# Patient Record
Sex: Female | Born: 1985 | Race: Black or African American | Hispanic: No | Marital: Single | Smoking: Current every day smoker
Health system: Southern US, Community
[De-identification: ages and names within clinical notes are randomized; demographics above are authoritative.]

## PROBLEM LIST (undated history)

## (undated) HISTORY — PX: PILONIDAL CYST DRAINAGE: SHX743

## (undated) HISTORY — PX: TUBAL LIGATION: SHX77

---

## 2018-10-19 ENCOUNTER — Other Ambulatory Visit: Payer: Self-pay

## 2018-10-19 ENCOUNTER — Emergency Department
Admission: EM | Admit: 2018-10-19 | Discharge: 2018-10-19 | Disposition: A | Discharge status: Home / Self Care | Payer: Medicaid Other | Attending: Emergency Medicine | Admitting: Emergency Medicine

## 2018-10-19 ENCOUNTER — Encounter: Payer: Self-pay | Admitting: Emergency Medicine

## 2018-10-19 DIAGNOSIS — F17210 Nicotine dependence, cigarettes, uncomplicated: Secondary | ICD-10-CM | POA: Insufficient documentation

## 2018-10-19 DIAGNOSIS — G43009 Migraine without aura, not intractable, without status migrainosus: Secondary | ICD-10-CM

## 2018-10-19 DIAGNOSIS — R51 Headache: Secondary | ICD-10-CM | POA: Diagnosis not present

## 2018-10-19 MED ORDER — BUTALBITAL-APAP-CAFFEINE 50-325-40 MG PO TABS
2.0000 | ORAL_TABLET | Freq: Once | ORAL | Status: AC
  Administered 2018-10-19: 2 via ORAL
  Filled 2018-10-19: qty 2

## 2018-10-19 MED ORDER — METOCLOPRAMIDE HCL 10 MG PO TABS
20.0000 mg | ORAL_TABLET | Freq: Once | ORAL | Status: AC
  Administered 2018-10-19: 20 mg via ORAL
  Filled 2018-10-19: qty 2

## 2018-10-19 MED ORDER — BUTALBITAL-APAP-CAFFEINE 50-325-40 MG PO TABS: 1.0000 | ORAL_TABLET | Freq: Four times a day (QID) | ORAL | 0 refills | Status: AC | PRN

## 2018-10-19 NOTE — ED Provider Notes (Signed)
[facility] Emergency Department Provider Note  Time seen: 8:05 AM  I have reviewed the triage vital signs and the nursing notes.   HISTORY  Chief Complaint Headache   HPI Morgan Baxter is a 33 y.o. female with no significant past medical history presents to the emergency department for headache.  According to the patient since yesterday she has had a "migraine headache."  States she rarely gets bad headaches but has had to come to the hospital before however got better after naproxen.  Patient states she has had somewhat of a scratchy throat as well but denies any fever cough congestion or shortness of breath.  States her son currently has nasal congestion.   History reviewed. No pertinent past medical history.  There are no active problems to display for this patient.   Past Surgical History:  Procedure Laterality Date  . PILONIDAL CYST DRAINAGE      Prior to Admission medications   Not on File    Allergies  Allergen Reactions  . Vicodin [Hydrocodone-Acetaminophen] Rash    No family history on file.  Social History Social History   Tobacco Use  . Smoking status: Current Every Day Smoker  . Smokeless tobacco: Never Used  Substance Use Topics  . Alcohol use: Not Currently  . Drug use: Not on file    Review of Systems Constitutional: Negative for fever. ENT: Negative for recent illness/congestion Cardiovascular: Negative for chest pain. Respiratory: Negative for shortness of breath.  Negative for cough. Gastrointestinal: Negative for abdominal pain Musculoskeletal: Negative for musculoskeletal complaints Neurological: Moderate headache All other ROS negative  ____________________________________________   PHYSICAL EXAM:  VITAL SIGNS: ED Triage Vitals [10/19/18 0753]  Enc Vitals Group     BP (!) 143/89     Pulse Rate 88     Resp 16     Temp 99.2 F (37.3 C)     Temp Source Oral     SpO2 99 %     Weight      Height    Head Circumference      Peak Flow      Pain Score      Pain Loc      Pain Edu?      Excl. in [place]?    Constitutional: Alert and oriented. Well appearing and in no distress. Eyes: Normal exam ENT      Head: Normocephalic and atraumatic      Mouth/Throat: Mucous membranes are moist. Cardiovascular: Normal rate, regular rhythm.  Respiratory: Normal respiratory effort without tachypnea nor retractions. Breath sounds are clear  Gastrointestinal: Soft and nontender. No distention.   Musculoskeletal: Nontender with normal range of motion in all extremities.  Neurologic:  Normal speech and language. No gross focal neurologic deficits Skin:  Skin is warm, dry and intact.  Psychiatric: Mood and affect are normal.   ____________________________________________  INITIAL IMPRESSION / ASSESSMENT AND PLAN / ED COURSE  Pertinent labs & imaging results that were available during my care of the patient were reviewed by me and considered in my medical decision making (see chart for details).   Patient presents emergency department for moderate headache since yesterday.  Overall the patient appears well, no fever cough or shortness of breath does state mild scratchy throat.  I offered to provide a corona swab for the patient however she refuses at this time states she does not want the swab performed as she is heard it is very painful.  I discussed treatment of the patient's  headache symptoms with a migraine cocktail medications, states she would prefer to hold off on an IV at this time.  We will treat with oral Reglan and Fioricet and reassess.  Patient agreeable to plan of care.  Patient states significant headache improvement, states he is ready to go home.  We will discharge with Fioricet I discussed return precautions, patient agreeable to plan of care.  Morgan Baxter was evaluated in Emergency Department on 10/19/2018 for the symptoms described in the history of present illness. She was evaluated in  the context of the global COVID-19 pandemic, which necessitated consideration that the patient might be at risk for infection with the SARS-CoV-2 virus that causes COVID-19. Institutional protocols and algorithms that pertain to the evaluation of patients at risk for COVID-19 are in a state of rapid change based on information released by regulatory bodies including the CDC and federal and state organizations. These policies and algorithms were followed during the patient's care in the ED.  ____________________________________________   FINAL CLINICAL IMPRESSION(S) / ED DIAGNOSES  Headache   Minna AntisPaduchowski, Maxine Fredman, MD 10/19/18 [phone]

## 2018-10-19 NOTE — ED Triage Notes (Signed)
Says she has had a headache for 2 days.  Has tried otc meds

## 2019-02-17 ENCOUNTER — Emergency Department: Payer: Medicaid Other

## 2019-02-17 ENCOUNTER — Encounter: Payer: Self-pay | Admitting: Emergency Medicine

## 2019-02-17 ENCOUNTER — Emergency Department
Admission: EM | Admit: 2019-02-17 | Discharge: 2019-02-17 | Disposition: A | Discharge status: Home / Self Care | Payer: Medicaid Other | Attending: Student | Admitting: Student

## 2019-02-17 ENCOUNTER — Other Ambulatory Visit: Payer: Self-pay

## 2019-02-17 DIAGNOSIS — R109 Unspecified abdominal pain: Secondary | ICD-10-CM | POA: Insufficient documentation

## 2019-02-17 DIAGNOSIS — F17210 Nicotine dependence, cigarettes, uncomplicated: Secondary | ICD-10-CM | POA: Insufficient documentation

## 2019-02-17 DIAGNOSIS — R112 Nausea with vomiting, unspecified: Secondary | ICD-10-CM

## 2019-02-17 LAB — URINALYSIS, ROUTINE W REFLEX MICROSCOPIC
Bacteria, UA: NONE SEEN
Bilirubin Urine: NEGATIVE
Glucose, UA: NEGATIVE mg/dL
Ketones, ur: NEGATIVE mg/dL
Leukocytes,Ua: NEGATIVE
Nitrite: NEGATIVE
Protein, ur: NEGATIVE mg/dL
RBC / HPF: >50 RBC/hpf — ABNORMAL HIGH (ref 0–5)
Specific Gravity, Urine: 1.027 (ref 1.005–1.030)
pH: 5 (ref 5.0–8.0)

## 2019-02-17 LAB — CBC WITH DIFFERENTIAL/PLATELET
Abs Immature Granulocytes: 0.01 10*3/uL (ref 0.00–0.07)
Basophils Absolute: 0 10*3/uL (ref 0.0–0.1)
Basophils Relative: 0 %
Eosinophils Absolute: 0.1 10*3/uL (ref 0.0–0.5)
Eosinophils Relative: 1 %
HCT: 39.3 % (ref 36.0–46.0)
Hemoglobin: 13.8 g/dL (ref 12.0–15.0)
Immature Granulocytes: 0 %
Lymphocytes Relative: 42 %
Lymphs Abs: 3.1 10*3/uL (ref 0.7–4.0)
MCH: 28.8 pg (ref 26.0–34.0)
MCHC: 35.1 g/dL (ref 30.0–36.0)
MCV: 82 fL (ref 80.0–100.0)
Monocytes Absolute: 0.5 10*3/uL (ref 0.1–1.0)
Monocytes Relative: 7 %
Neutro Abs: 3.6 10*3/uL (ref 1.7–7.7)
Neutrophils Relative %: 50 %
Platelets: 276 10*3/uL (ref 150–400)
RBC: 4.79 MIL/uL (ref 3.87–5.11)
RDW: 13.2 % (ref 11.5–15.5)
WBC: 7.2 10*3/uL (ref 4.0–10.5)
nRBC: 0 % (ref 0.0–0.2)

## 2019-02-17 LAB — COMPREHENSIVE METABOLIC PANEL
ALT: 11 U/L (ref 0–44)
AST: 15 U/L (ref 15–41)
Albumin: 3.9 g/dL (ref 3.5–5.0)
Alkaline Phosphatase: 35 U/L — ABNORMAL LOW (ref 38–126)
Anion gap: 10 (ref 5–15)
BUN: 18 mg/dL (ref 6–20)
CO2: 21 mmol/L — ABNORMAL LOW (ref 22–32)
Calcium: 9.2 mg/dL (ref 8.9–10.3)
Chloride: 106 mmol/L (ref 98–111)
Creatinine, Ser: 1.25 mg/dL — ABNORMAL HIGH (ref 0.44–1.00)
GFR calc Af Amer: >60 mL/min (ref >60)
GFR calc non Af Amer: 56 mL/min — ABNORMAL LOW (ref >60)
Glucose, Bld: 99 mg/dL (ref 70–99)
Potassium: 3.8 mmol/L (ref 3.5–5.1)
Sodium: 137 mmol/L (ref 135–145)
Total Bilirubin: 0.7 mg/dL (ref 0.3–1.2)
Total Protein: 7 g/dL (ref 6.5–8.1)

## 2019-02-17 LAB — LIPASE, BLOOD: Lipase: 21 U/L (ref 11–51)

## 2019-02-17 LAB — POCT PREGNANCY, URINE: Preg Test, Ur: NEGATIVE

## 2019-02-17 MED ORDER — FENTANYL CITRATE (PF) 100 MCG/2ML IJ SOLN
50.0000 ug | Freq: Once | INTRAMUSCULAR | Status: AC
  Administered 2019-02-17: 50 ug via INTRAVENOUS

## 2019-02-17 MED ORDER — OXYCODONE HCL 5 MG PO TABS
5.0000 mg | ORAL_TABLET | Freq: Once | ORAL | Status: AC
  Administered 2019-02-17: 5 mg via ORAL
  Filled 2019-02-17: qty 1

## 2019-02-17 MED ORDER — KETOROLAC TROMETHAMINE 30 MG/ML IJ SOLN
15.0000 mg | Freq: Once | INTRAMUSCULAR | Status: AC
  Administered 2019-02-17: 08:00:00 15 mg via INTRAVENOUS
  Filled 2019-02-17: qty 1

## 2019-02-17 MED ORDER — MORPHINE SULFATE (PF) 4 MG/ML IV SOLN
4.0000 mg | Freq: Once | INTRAVENOUS | Status: AC
  Administered 2019-02-17: 4 mg via INTRAVENOUS
  Filled 2019-02-17: qty 1

## 2019-02-17 MED ORDER — DROPERIDOL 2.5 MG/ML IJ SOLN
2.5000 mg | Freq: Once | INTRAMUSCULAR | Status: AC
  Administered 2019-02-17: 2.5 mg via INTRAVENOUS
  Filled 2019-02-17: qty 2

## 2019-02-17 MED ORDER — ONDANSETRON HCL 4 MG/2ML IJ SOLN: 4.0000 mg | INTRAMUSCULAR | Status: DC

## 2019-02-17 MED ORDER — OXYCODONE HCL 5 MG PO TABS: 5.0000 mg | ORAL_TABLET | Freq: Three times a day (TID) | ORAL | 0 refills | Status: AC | PRN

## 2019-02-17 MED ORDER — FENTANYL CITRATE (PF) 100 MCG/2ML IJ SOLN
INTRAMUSCULAR | Status: AC
  Filled 2019-02-17: qty 2

## 2019-02-17 NOTE — ED Notes (Signed)
Patient educated that she needs to stay in bed after receiving medication to be monitored and for safety. Patient continues to get out of bed despite redirection. Will monitor

## 2019-02-17 NOTE — ED Triage Notes (Signed)
Pt here with c/o abdominal pain on bilateral lower quadrants. States she's never had pain like this in her life, denies hx of kidney stones, pain radiates around to her lower back bilaterally, does have her appendix, pain started around 0200, pt tearful in triage, pacing. Has had tubal ligation.Marland Kitchen

## 2019-02-17 NOTE — ED Provider Notes (Signed)
[facility] Emergency Department Provider Note  ____________________________________________   First MD Initiated Contact with Patient 02/17/19 0636     (approximate)  I have reviewed the triage vital signs and the nursing notes.   HISTORY  Chief Complaint Abdominal Pain   Level 5 caveat:  history/ROS limited by acute/critical illness   HPI Morgan Baxter is a 33 y.o. female with no contributory past medical history who presents by private vehicle for evaluation of acute onset and severe pain in the left side of her abdomen and back.  She says that it woke her up from sleep and is severe and sharp.  She cannot find a position of comfort and she is wandering around her exam room yelling out in pain and then will kneel down next to her bed.  She says that nothing makes it better and nothing in particular makes it worse.  She has had some nausea and at least one episode of vomiting.  No dysuria nor hematuria and her last menstrual cycle was a couple weeks ago.  No vaginal complaints.  No history of ovarian cyst nor kidney stones.  She denies contact with COVID-19 patients.  She denies fever/chills, chest pain, shortness of breath, and cough.  No other abdominal pain.  No alcohol use.        History reviewed. No pertinent past medical history.  There are no active problems to display for this patient.   Past Surgical History:  Procedure Laterality Date  . PILONIDAL CYST DRAINAGE      Prior to Admission medications   Medication Sig Start Date End Date Taking? Authorizing Provider  butalbital-acetaminophen-caffeine (FIORICET) 50-325-40 MG tablet Take 1-2 tablets by mouth every 6 (six) hours as needed for headache. 10/19/18 10/19/19  Minna AntisPaduchowski, Kevin, MD    Allergies Vicodin [hydrocodone-acetaminophen]  No family history on file.  Social History Social History   Tobacco Use  . Smoking status: Current Every Day Smoker  . Smokeless tobacco: Never  Used  Substance Use Topics  . Alcohol use: Not Currently  . Drug use: Not on file    Review of Systems Level 5 caveat:  history/ROS limited by acute/critical illness   constitutional: No fever/chills Eyes: No visual changes. ENT: No sore throat. Cardiovascular: Denies chest pain. Respiratory: Denies shortness of breath. Gastrointestinal: Severe left-sided abdominal and flank pain.  Some associated nausea and vomiting. Genitourinary: Negative for dysuria.  Negative for hematuria and vaginal bleeding. Musculoskeletal: Negative for neck pain.   Integumentary: Negative for rash. Neurological: Negative for headaches, focal weakness or numbness.   ____________________________________________   PHYSICAL EXAM:  VITAL SIGNS: ED Triage Vitals [02/17/19 0612]  Enc Vitals Group     BP (!) 158/116     Pulse Rate (!) 111     Resp (!) 26     Temp 98.3 F (36.8 C)     Temp Source Oral     SpO2 100 %     Weight 113.4 kg (250 lb)     Height 1.727 m (5\' 8" )     Head Circumference      Peak Flow      Pain Score 10     Pain Loc      Pain Edu?      Excl. in [place]?     Constitutional: Alert and oriented.  Agitated and in pain, crying out and yelling in pain, staggering around her exam room due to an inability to find a position of comfort. Eyes: Conjunctivae are  normal.  Head: Atraumatic. Nose: No congestion/rhinnorhea. Mouth/Throat: Patient is wearing a mask. Neck: No stridor.  No meningeal signs.   Cardiovascular: Normal rate, regular rhythm. Good peripheral circulation. Grossly normal heart sounds. Respiratory: Normal respiratory effort.  No retractions. Gastrointestinal: Soft and nondistended.  Tender to palpation of the left side of her abdomen and her left flank.   Musculoskeletal: Left CVA tenderness to percussion.  No lower extremity tenderness nor edema. No gross deformities of extremities. Neurologic:  Normal speech and language. No gross focal neurologic deficits are  appreciated.  Skin:  Skin is warm, dry and intact.  ____________________________________________   LABS (all labs ordered are listed, but only abnormal results are displayed)  Labs Reviewed  CBC WITH DIFFERENTIAL/PLATELET  COMPREHENSIVE METABOLIC PANEL  LIPASE, BLOOD  URINALYSIS, ROUTINE W REFLEX MICROSCOPIC  POCT PREGNANCY, URINE  POC URINE PREG, ED   ____________________________________________  EKG  None - EKG not ordered by ED physician ____________________________________________  RADIOLOGY Ursula Alert, personally viewed and evaluated these images (plain radiographs) as part of my medical decision making, as well as reviewing the written report by the radiologist.  ED MD interpretation: CT renal stone protocol is pending  Official radiology report(s): No results found.  ____________________________________________   PROCEDURES   Procedure(s) performed (including Critical Care):  Procedures   ____________________________________________   INITIAL IMPRESSION / MDM / [place] / ED COURSE  As part of my medical decision making, I reviewed the following data within the [place] History obtained from family, Nursing notes reviewed and incorporated, Labs reviewed , Old chart reviewed, Patient signed out to , Notes from prior ED visits and [place] Controlled Substance Database   Differential diagnosis includes, but is not limited to, renal/ureteral colic, ovarian torsion, UTI/pyelonephritis, ectopic pregnancy, diverticulitis, SBO/ileus.  The patient's presentation is most consistent with ureteral colic.  Her urine pregnancy test is negative.  Lab work is still pending except for a normal CBC.  I have ordered morphine 4 mg IV and droperidol 2.5 mg IV to help both with nausea and as a calming agent.  She was instructed that she needs to stay in her bed for her safety.  I am signing out care to Dr. Joan Mayans for follow-up on the rest of her lab  work and her CT renal stone protocol and to reassess.  At this point I do not think the patient would benefit from a pelvic exam although she may need a pelvic ultrasound if her CT scan demonstrates a large adnexal mass.          ____________________________________________  FINAL CLINICAL IMPRESSION(S) / ED DIAGNOSES  Final diagnoses:  Left sided abdominal pain  Left flank pain  Nausea and vomiting, intractability of vomiting not specified, unspecified vomiting type     MEDICATIONS GIVEN DURING THIS VISIT:  Medications  morphine 4 MG/ML injection 4 mg (4 mg Intravenous Given 02/17/19 0645)  droperidol (INAPSINE) 2.5 MG/ML injection 2.5 mg (2.5 mg Intravenous Given 02/17/19 8546)     ED Discharge Orders    None      *Please note:  Tomma Ehinger was evaluated in Emergency Department on 02/17/2019 for the symptoms described in the history of present illness. She was evaluated in the context of the global COVID-19 pandemic, which necessitated consideration that the patient might be at risk for infection with the SARS-CoV-2 virus that causes COVID-19. Institutional protocols and algorithms that pertain to the evaluation of patients at risk for COVID-19 are in a state  of rapid change based on information released by regulatory bodies including the CDC and federal and state organizations. These policies and algorithms were followed during the patient's care in the ED.  Some ED evaluations and interventions may be delayed as a result of limited staffing during the pandemic.*  Note:  This document was prepared using Dragon voice recognition software and may include unintentional dictation errors.   Loleta Rose, MD 02/17/19 [phone]

## 2019-02-17 NOTE — ED Notes (Signed)
Pt states she has been vomiting "all morning." denies diarrhea.

## 2019-02-17 NOTE — ED Provider Notes (Signed)
Labs reveal mildly elevated creatinine, 1.25.  Negative pregnancy.  UA with blood, no evidence of infection.  CT reveals bilateral nephrolithiasis.  Minimal left hydroureteronephrosis without evidence of an obstructing ureteral calculi.  Mild inflammatory changes around the left kidney.  Given no evidence of infection on UA, and presentation of acute onset colicky left-sided pain, suspect these findings likely represent a recently passed stone.  Updated patient on results.  Her pain is improved, she is now relaxing more comfortably and has tolerated PO in the ED.  As such, patient is stable for discharge with outpatient follow-up.  Will provide short course pain medication and given return precautions.  She is agreeable with the plan and discharge.   [name]., MD 02/17/19 [phone]

## 2019-02-17 NOTE — ED Notes (Signed)
Patient transported to CT 

## 2019-02-17 NOTE — ED Notes (Signed)
Patient c/o continued pain, no relief from morphine.  States pain remains but she feels more relaxed.  Transported to CT, will continue to monitor.

## 2019-02-17 NOTE — Discharge Instructions (Addendum)
Thank you for letting us take care of you in the emergency department today.  ° °Please continue to take any regular, prescribed medications.  ° °Please follow up with: °- Your primary care doctor to review your ER visit and follow up on your symptoms.  ° °Please return to the ER for any new or worsening symptoms.  ° °

## 2019-02-17 NOTE — ED Notes (Signed)
AAOx3.  Skin warm and dry.  NAD 

## 2019-02-17 NOTE — ED Notes (Signed)
Returns from CT scan.  AAOx3.  Skin warm and dry.  Sitting upright.  Posture relaxed.  Continues to c/o pain.  States that she is unable to wait for CT results for additional pain relief.  Will notify Dr. Joan Mayans.

## 2020-10-01 ENCOUNTER — Ambulatory Visit
Admission: EM | Admit: 2020-10-01 | Discharge: 2020-10-01 | Disposition: A | Discharge status: Home / Self Care | Payer: Medicaid Other | Attending: Emergency Medicine | Admitting: Emergency Medicine

## 2020-10-01 ENCOUNTER — Other Ambulatory Visit: Payer: Self-pay

## 2020-10-01 DIAGNOSIS — H6692 Otitis media, unspecified, left ear: Secondary | ICD-10-CM

## 2020-10-01 DIAGNOSIS — H6123 Impacted cerumen, bilateral: Secondary | ICD-10-CM

## 2020-10-01 MED ORDER — AMOXICILLIN 400 MG/5ML PO SUSR: 500.0000 mg | Freq: Three times a day (TID) | ORAL | 0 refills | Status: AC

## 2020-10-01 NOTE — Discharge Instructions (Addendum)
Take the amoxicillin as directed.  Follow up with your primary care provider if your symptoms are not improving.   ° ° °

## 2020-10-01 NOTE — ED Provider Notes (Signed)
[name]    CSN: [id number] Arrival date & time: 10/01/20  1016      History   Chief Complaint Chief Complaint  Patient presents with   Otalgia    HPI Morgan Baxter is a 35 y.o. female.  Patient presents with acute left ear pain x1 day.  She denies ear drainage, change in hearing, fever, chills, sore throat, cough, or other symptoms.  Treatment at home with ibuprofen.  No pertinent medical history.  The history is provided by the patient.   History reviewed. No pertinent past medical history.  There are no problems to display for this patient.   Past Surgical History:  Procedure Laterality Date   PILONIDAL CYST DRAINAGE      OB History   No obstetric history on file.      Home Medications    Prior to Admission medications   Medication Sig Start Date End Date Taking? Authorizing Provider  amoxicillin (AMOXIL) 400 MG/5ML suspension Take 6.3 mLs (500 mg total) by mouth 3 (three) times daily for 7 days. 10/01/20 10/08/20 Yes Mickie Bail, NP    Family History No family history on file.  Social History Social History   Tobacco Use   Smoking status: Every Day    Pack years: 0.00   Smokeless tobacco: Never  Substance Use Topics   Alcohol use: Not Currently     Allergies   Vicodin [hydrocodone-acetaminophen]   Review of Systems Review of Systems  Constitutional:  Negative for chills and fever.  HENT:  Positive for ear pain. Negative for ear discharge, hearing loss and sore throat.   Respiratory:  Negative for cough and shortness of breath.   Cardiovascular:  Negative for chest pain and palpitations.  Gastrointestinal:  Negative for abdominal pain and vomiting.  Skin:  Negative for color change and rash.  All other systems reviewed and are negative.   Physical Exam Triage Vital Signs ED Triage Vitals  Enc Vitals Group     BP      Pulse      Resp      Temp      Temp src      SpO2      Weight      Height      Head Circumference       Peak Flow      Pain Score      Pain Loc      Pain Edu?      Excl. in [place]?    No data found.  Updated Vital Signs BP 129/84   Pulse 81   Temp 98.9 F (37.2 C) (Oral)   Resp 16   Ht 5\' 3"  (1.6 m)   Wt 240 lb (108.9 kg)   SpO2 98%   BMI 42.51 kg/m   Visual Acuity Right Eye Distance:   Left Eye Distance:   Bilateral Distance:    Right Eye Near:   Left Eye Near:    Bilateral Near:     Physical Exam Vitals and nursing note reviewed.  Constitutional:      General: She is not in acute distress.    Appearance: She is well-developed. She is not ill-appearing.  HENT:     Head: Normocephalic and atraumatic.     Right Ear: There is impacted cerumen.     Left Ear: There is impacted cerumen.     Ears:     Comments: Left TM noted to be erythematous after cerumen impaction removed.  Nose: Nose normal.     Mouth/Throat:     Mouth: Mucous membranes are moist.     Pharynx: Oropharynx is clear.  Eyes:     Conjunctiva/sclera: Conjunctivae normal.  Cardiovascular:     Rate and Rhythm: Normal rate and regular rhythm.     Heart sounds: Normal heart sounds.  Pulmonary:     Effort: Pulmonary effort is normal. No respiratory distress.     Breath sounds: Normal breath sounds.  Abdominal:     Palpations: Abdomen is soft.     Tenderness: There is no abdominal tenderness.  Musculoskeletal:     Cervical back: Neck supple.  Skin:    General: Skin is warm and dry.  Neurological:     General: No focal deficit present.     Mental Status: She is alert and oriented to person, place, and time.  Psychiatric:        Mood and Affect: Mood normal.        Behavior: Behavior normal.     UC Treatments / Results  Labs (all labs ordered are listed, but only abnormal results are displayed) Labs Reviewed - No data to display  EKG   Radiology No results found.  Procedures Procedures (including critical care time)  Medications Ordered in UC Medications - No data to  display  Initial Impression / Assessment and Plan / UC Course  I have reviewed the triage vital signs and the nursing notes.  Pertinent labs & imaging results that were available during my care of the patient were reviewed by me and considered in my medical decision making (see chart for details).  Left otitis media, bilateral cerumen impaction.  Cerumen removed via irrigation.  Treating with amoxicillin.  Education provided on otitis media.  Instructed patient to follow-up with her PCP if her symptoms or not improving.  She agrees to plan of care.   Final Clinical Impressions(s) / UC Diagnoses   Final diagnoses:  Left otitis media, unspecified otitis media type  Bilateral impacted cerumen     Discharge Instructions      Take the amoxicillin as directed.    Follow up with your primary care provider if your symptoms are not improving.         ED Prescriptions     Medication Sig Dispense Auth. Provider   amoxicillin (AMOXIL) 400 MG/5ML suspension Take 6.3 mLs (500 mg total) by mouth 3 (three) times daily for 7 days. 132.3 mL Mickie Bail, NP      PDMP not reviewed this encounter.   Mickie Bail, NP 10/01/20 [phone]

## 2020-10-01 NOTE — ED Triage Notes (Signed)
Pt reports having L ear pain that began yesterday.

## 2020-10-30 ENCOUNTER — Other Ambulatory Visit: Payer: Self-pay

## 2020-10-30 ENCOUNTER — Encounter: Payer: Self-pay | Admitting: Emergency Medicine

## 2020-10-30 ENCOUNTER — Ambulatory Visit
Admission: EM | Admit: 2020-10-30 | Discharge: 2020-10-30 | Disposition: A | Discharge status: Home / Self Care | Payer: Medicaid Other

## 2020-10-30 DIAGNOSIS — H109 Unspecified conjunctivitis: Secondary | ICD-10-CM | POA: Diagnosis not present

## 2020-10-30 DIAGNOSIS — H5789 Other specified disorders of eye and adnexa: Secondary | ICD-10-CM | POA: Diagnosis not present

## 2020-10-30 MED ORDER — ERYTHROMYCIN 5 MG/GM OP OINT: TOPICAL_OINTMENT | OPHTHALMIC | 0 refills | Status: DC

## 2020-10-30 MED ORDER — FLUORESCEIN SODIUM 1 MG OP STRP
1.0000 | ORAL_STRIP | Freq: Once | OPHTHALMIC | Status: AC
  Administered 2020-10-30: 1 via OPHTHALMIC

## 2020-10-30 MED ORDER — TETRACAINE HCL 0.5 % OP SOLN
1.0000 [drp] | Freq: Once | OPHTHALMIC | Status: AC
  Administered 2020-10-30: 1 [drp] via OPHTHALMIC

## 2020-10-30 NOTE — ED Provider Notes (Signed)
[facility]    CSN: [id number] Arrival date & time: 10/30/20  1156      History   Chief Complaint Chief Complaint  Patient presents with   Eye Problem    left    HPI Morgan Baxter is a 35 y.o. female.   35 year old female presents to urgent care states she got hand sanitizer in her left eye yesterday.  Feels like she has something in her eye.  No visual change, took over-the-counter eyedrops as were recommended at pharmacy(no relief with eyedrop use)  The history is provided by the patient. No language interpreter was used.   History reviewed. No pertinent past medical history.  Patient Active Problem List   Diagnosis Date Noted   Eye irritation 10/30/2020   Conjunctivitis of left eye 10/30/2020     Past Surgical History:  Procedure Laterality Date   PILONIDAL CYST DRAINAGE      OB History   No obstetric history on file.      Home Medications    Prior to Admission medications   Medication Sig Start Date End Date Taking? Authorizing Provider  erythromycin ophthalmic ointment Place a 1/2 inch ribbon of ointment into the lower eyelids every 6 hours x 5 days 10/30/20  Yes Tomi Paddock, Para March, NP  medroxyPROGESTERone (DEPO-PROVERA) 150 MG/ML injection Inject into the muscle. 08/12/19 10/29/21 Yes [provider]    Family History No family history on file.  Social History Social History   Tobacco Use   Smoking status: Every Day    Types: Cigarettes   Smokeless tobacco: Never  Vaping Use   Vaping Use: Never used  Substance Use Topics   Alcohol use: Not Currently   Drug use: Not Currently     Allergies   Vicodin [hydrocodone-acetaminophen]   Review of Systems Review of Systems  Eyes:  Positive for redness.  All other systems reviewed and are negative.   Physical Exam Triage Vital Signs ED Triage Vitals  Enc Vitals Group     BP 10/30/20 1322 118/77     Pulse Rate 10/30/20 1322 75     Resp 10/30/20 1322 18     Temp 10/30/20  1322 98.7 F (37.1 C)     Temp Source 10/30/20 1322 Oral     SpO2 10/30/20 1322 100 %     Weight 10/30/20 1320 240 lb 1.3 oz (108.9 kg)     Height 10/30/20 1320 5\' 3"  (1.6 m)     Head Circumference --      Peak Flow --      Pain Score 10/30/20 1319 6     Pain Loc --      Pain Edu? --      Excl. in GC? --    No data found.  Updated Vital Signs BP 118/77 (BP Location: Left Arm)   Pulse 75   Temp 98.7 F (37.1 C) (Oral)   Resp 18   Ht 5\' 3"  (1.6 m)   Wt 240 lb 1.3 oz (108.9 kg)   SpO2 100%   BMI 42.53 kg/m   Visual Acuity Right Eye Distance: 20/25 uncorrected Left Eye Distance: 20/200 uncorrected Bilateral Distance: 20/25 uncorrected  Right Eye Near:   Left Eye Near:    Bilateral Near:     Physical Exam Vitals and nursing note reviewed.  HENT:     Head: Normocephalic.  Eyes:     General: Lids are normal. Vision grossly intact.     Extraocular Movements: Extraocular movements intact.  Conjunctiva/sclera: Conjunctivae normal.     Pupils:     Left eye: No fluorescein uptake.     Slit lamp exam:    Right eye: No photophobia.     Left eye: No photophobia.     UC Treatments / Results  Labs (all labs ordered are listed, but only abnormal results are displayed) Labs Reviewed - No data to display  EKG   Radiology No results found.  Procedures Procedures (including critical care time)  Medications Ordered in UC Medications  tetracaine (PONTOCAINE) 0.5 % ophthalmic solution 1 drop (1 drop Left Eye Given 10/30/20 1356)  fluorescein ophthalmic strip 1 strip (1 strip Left Eye Given 10/30/20 1356)    Initial Impression / Assessment and Plan / UC Course  I have reviewed the triage vital signs and the nursing notes.  Pertinent labs & imaging results that were available during my care of the patient were reviewed by me and considered in my medical decision making (see chart for details).     Ddx: Corneal abrasion, conjunctivitis Final Clinical Impressions(s)  / UC Diagnoses   Final diagnoses:  Eye irritation  Conjunctivitis of left eye, unspecified conjunctivitis type     Discharge Instructions      Use ointment as directged. Do not rub or mes w eyes except for med administration. Wash hands prior to applying med. Follow up with eye doctor. Go to Er if worse     ED Prescriptions     Medication Sig Dispense Auth. Provider   erythromycin ophthalmic ointment Place a 1/2 inch ribbon of ointment into the lower eyelids every 6 hours x 5 days 3.5 g Haydn Hutsell, Para March, NP      PDMP not reviewed this encounter.   Clancy Gourd, NP 10/30/20 [phone]

## 2020-10-30 NOTE — ED Triage Notes (Signed)
Pt states she used hand sanitizer yesterday and it squirted in her eye. She states her left eye feels like there is a cloudy coating on her eye. She states it feels like something is in her eye. She has tried OTC eye drops and feels it made it worse.

## 2020-10-30 NOTE — Discharge Instructions (Addendum)
Use ointment as directged. Do not rub or mes w eyes except for med administration. Wash hands prior to applying med. Follow up with eye doctor. Go to Er if worse

## 2020-12-05 ENCOUNTER — Encounter: Payer: Self-pay | Admitting: Emergency Medicine

## 2020-12-05 ENCOUNTER — Other Ambulatory Visit: Payer: Self-pay

## 2020-12-05 DIAGNOSIS — F17210 Nicotine dependence, cigarettes, uncomplicated: Secondary | ICD-10-CM | POA: Diagnosis not present

## 2020-12-05 DIAGNOSIS — Z8616 Personal history of COVID-19: Secondary | ICD-10-CM | POA: Diagnosis not present

## 2020-12-05 DIAGNOSIS — R519 Headache, unspecified: Secondary | ICD-10-CM | POA: Diagnosis present

## 2020-12-05 DIAGNOSIS — R110 Nausea: Secondary | ICD-10-CM | POA: Insufficient documentation

## 2020-12-05 LAB — POC URINE PREG, ED: Preg Test, Ur: NEGATIVE

## 2020-12-05 NOTE — ED Triage Notes (Signed)
Pt reports that she has had a migraine for two weeks. She has tried Ibuprofen, dark chocolate and Mt Dew nothing is helping. Sound makes it worse. Pt reports that it is behind her eyes.

## 2020-12-06 ENCOUNTER — Emergency Department: Payer: 59

## 2020-12-06 ENCOUNTER — Emergency Department
Admission: EM | Admit: 2020-12-06 | Discharge: 2020-12-06 | Disposition: A | Discharge status: Home / Self Care | Payer: 59 | Attending: Emergency Medicine | Admitting: Emergency Medicine

## 2020-12-06 DIAGNOSIS — R519 Headache, unspecified: Secondary | ICD-10-CM | POA: Diagnosis not present

## 2020-12-06 LAB — CBC WITH DIFFERENTIAL/PLATELET
Abs Immature Granulocytes: 0.01 10*3/uL (ref 0.00–0.07)
Basophils Absolute: 0 10*3/uL (ref 0.0–0.1)
Basophils Relative: 1 %
Eosinophils Absolute: 0.1 10*3/uL (ref 0.0–0.5)
Eosinophils Relative: 1 %
HCT: 36.3 % (ref 36.0–46.0)
Hemoglobin: 13 g/dL (ref 12.0–15.0)
Immature Granulocytes: 0 %
Lymphocytes Relative: 51 %
Lymphs Abs: 3.4 10*3/uL (ref 0.7–4.0)
MCH: 30.7 pg (ref 26.0–34.0)
MCHC: 35.8 g/dL (ref 30.0–36.0)
MCV: 85.6 fL (ref 80.0–100.0)
Monocytes Absolute: 0.5 10*3/uL (ref 0.1–1.0)
Monocytes Relative: 7 %
Neutro Abs: 2.6 10*3/uL (ref 1.7–7.7)
Neutrophils Relative %: 40 %
Platelets: 225 10*3/uL (ref 150–400)
RBC: 4.24 MIL/uL (ref 3.87–5.11)
RDW: 12.9 % (ref 11.5–15.5)
WBC: 6.6 10*3/uL (ref 4.0–10.5)
nRBC: 0 % (ref 0.0–0.2)

## 2020-12-06 LAB — BASIC METABOLIC PANEL
Anion gap: 7 (ref 5–15)
BUN: 20 mg/dL (ref 6–20)
CO2: 26 mmol/L (ref 22–32)
Calcium: 8.8 mg/dL — ABNORMAL LOW (ref 8.9–10.3)
Chloride: 105 mmol/L (ref 98–111)
Creatinine, Ser: 0.89 mg/dL (ref 0.44–1.00)
GFR, Estimated: >60 mL/min (ref >60)
Glucose, Bld: 87 mg/dL (ref 70–99)
Potassium: 4.1 mmol/L (ref 3.5–5.1)
Sodium: 138 mmol/L (ref 135–145)

## 2020-12-06 MED ORDER — DIPHENHYDRAMINE HCL 50 MG/ML IJ SOLN
50.0000 mg | Freq: Once | INTRAMUSCULAR | Status: AC
  Administered 2020-12-06: 50 mg via INTRAVENOUS
  Filled 2020-12-06: qty 1

## 2020-12-06 MED ORDER — TRAMADOL HCL 50 MG PO TABS: 50.0000 mg | ORAL_TABLET | Freq: Three times a day (TID) | ORAL | 0 refills | Status: AC | PRN

## 2020-12-06 MED ORDER — SODIUM CHLORIDE 0.9 % IV BOLUS (SEPSIS)
1000.0000 mL | Freq: Once | INTRAVENOUS | Status: AC
  Administered 2020-12-06: 1000 mL via INTRAVENOUS

## 2020-12-06 MED ORDER — PROCHLORPERAZINE EDISYLATE 10 MG/2ML IJ SOLN
10.0000 mg | Freq: Once | INTRAMUSCULAR | Status: AC
  Administered 2020-12-06: 10 mg via INTRAVENOUS
  Filled 2020-12-06: qty 2

## 2020-12-06 MED ORDER — IOHEXOL 350 MG/ML SOLN
75.0000 mL | Freq: Once | INTRAVENOUS | Status: AC | PRN
  Administered 2020-12-06: 75 mL via INTRAVENOUS

## 2020-12-06 MED ORDER — ONDANSETRON 4 MG PO TBDP: 4.0000 mg | ORAL_TABLET | Freq: Four times a day (QID) | ORAL | 0 refills | Status: DC | PRN

## 2020-12-06 NOTE — Discharge Instructions (Signed)
I recommend close follow-up with your primary care provider as well as neurology as an outpatient.  Your imaging showed signs that could represent idiopathic intracranial hypertension also known as pseudotumor cerebri.  I have recommended a lumbar puncture for further evaluation.  Please return the emergency department if you have worsening headache, numbness or weakness, changes in your vision, fever, neck pain or neck stiffness.  CT venogram IMPRESSION:  1. Negative for dural venous sinus thrombosis.  2. Positive for partially empty sella, often a normal anatomic  variant but can be associated with idiopathic intracranial  hypertension (pseudotumor cerebri).  3. Otherwise normal Head CT.    You are being provided a prescription for opiates (also known as narcotics) for pain control.  Opiates can be addictive and should only be used when absolutely necessary for pain control when other alternatives do not work.  We recommend you only use them for the recommended amount of time and only as prescribed.  Please do not take with other sedative medications or alcohol.  Please do not drive, operate machinery, make important decisions while taking opiates.  Please note that these medications can be addictive and have high abuse potential.  Patients can become addicted to narcotics after only taking them for a few days.  Please keep these medications locked away from children, teenagers or any family members with history of substance abuse.  Narcotic pain medicine may also make you constipated.  You may use over-the-counter medications such as MiraLAX, Colace to prevent constipation.  If you become constipated you may use over-the-counter enemas as needed.  Itching and nausea are common side effects of narcotic pain medication.  If you develop uncontrolled vomiting or a rash, please stop these medications.

## 2020-12-06 NOTE — ED Notes (Signed)
Pt given pillow.

## 2020-12-06 NOTE — ED Notes (Signed)
Pt back from CT

## 2020-12-06 NOTE — ED Notes (Signed)
Pt states she is feeling better .

## 2020-12-06 NOTE — ED Provider Notes (Signed)
[facility] Emergency Department Provider Note  ____________________________________________   Event Date/Time   First MD Initiated Contact with Patient 12/06/20 0244     (approximate)  I have reviewed the triage vital signs and the nursing notes.   HISTORY  Chief Complaint Headache    HPI Morgan Baxter is a 35 y.o. female with no significant past medical history who presents to the emergency department with complaints of a frontal throbbing headache behind her eyes that has been ongoing for 2 weeks.  She states symptoms started after having COVID-19 2 weeks ago.  She denies ever having any fevers.  No neck pain or neck stiffness.  Pain is worse with lights and sounds and she has had associated nausea without vomiting.  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  No current numbness or focal weakness.  No head injury.  States she has never been told that she had migraine headaches before but states she has had to come to the ER once before for headache in 2020 and was given Fioricet which she states did not really help with her pain.  She states she has tried multiple over-the-counter medications, increased caffeine intake without any relief.        History reviewed. No pertinent past medical history.  Patient Active Problem List   Diagnosis Date Noted   Eye irritation 10/30/2020   Conjunctivitis of left eye 10/30/2020    Past Surgical History:  Procedure Laterality Date   PILONIDAL CYST DRAINAGE      Prior to Admission medications   Medication Sig Start Date End Date Taking? Authorizing Provider  ondansetron (ZOFRAN ODT) 4 MG disintegrating tablet Take 1 tablet (4 mg total) by mouth every 6 (six) hours as needed for nausea or vomiting. 12/06/20  Yes Abran Gavigan, Layla MawKristen N, DO  traMADol (ULTRAM) 50 MG tablet Take 1 tablet (50 mg total) by  mouth every 8 (eight) hours as needed. 12/06/20 12/06/21 Yes Janea Schwenn, Layla MawKristen N, DO  erythromycin ophthalmic ointment Place a 1/2 inch ribbon of ointment into the lower eyelids every 6 hours x 5 days 10/30/20   Defelice, Para MarchJeanette, NP  medroxyPROGESTERone (DEPO-PROVERA) 150 MG/ML injection Inject into the muscle. 08/12/19 10/29/21  [provider]    Allergies Compazine [prochlorperazine] and Vicodin [hydrocodone-acetaminophen]  No family history on file.  Social History Social History   Tobacco Use   Smoking status: Every Day    Types: Cigarettes   Smokeless tobacco: Never  Vaping Use   Vaping Use: Never used  Substance Use Topics   Alcohol use: Not Currently   Drug use: Not Currently    Review of Systems Constitutional: No fever. Eyes: No visual changes. ENT: No sore throat. Cardiovascular: Denies chest pain. Respiratory: Denies shortness of breath. Gastrointestinal: No nausea, vomiting, diarrhea. Genitourinary: Negative for dysuria. Musculoskeletal: Negative for back pain. Skin: Negative for rash. Neurological: Negative for focal weakness or numbness.  ____________________________________________   PHYSICAL EXAM:  VITAL SIGNS: ED Triage Vitals  Enc Vitals Group     BP 12/05/20 2204 (!) 149/96     Pulse Rate 12/05/20 2204 80     Resp 12/05/20 2204 18     Temp 12/05/20 2204 99.3 F (37.4 C)     Temp Source 12/05/20 2204 Oral     SpO2 12/05/20 2204 100 %     Weight 12/05/20 2205 250 lb (113.4 kg)  Height 12/05/20 2205 5\' 3"  (1.6 m)     Head Circumference --      Peak Flow --      Pain Score 12/05/20 2205 10     Pain Loc --      Pain Edu? --      Excl. in GC? --    CONSTITUTIONAL: Alert and oriented and responds appropriately to questions. Well-appearing; well-nourished HEAD: Normocephalic, atraumatic EYES: Conjunctivae clear, pupils appear equal, EOM appear intact; + photophobia ENT: normal nose; moist mucous membranes NECK: Supple, normal ROM; no  meningismus CARD: RRR; S1 and S2 appreciated; no murmurs, no clicks, no rubs, no gallops RESP: Normal chest excursion without splinting or tachypnea; breath sounds clear and equal bilaterally; no wheezes, no rhonchi, no rales, no hypoxia or respiratory distress, speaking full sentences ABD/GI: Normal bowel sounds; non-distended; soft, non-tender, no rebound, no guarding, no peritoneal signs, no hepatosplenomegaly BACK: The back appears normal EXT: Normal ROM in all joints; no deformity noted, no edema; no cyanosis SKIN: Normal color for age and race; warm; no rash on exposed skin NEURO: Moves all extremities equally, normal gait, normal sensation diffusely, cranial nerves II through XII intact, normal speech PSYCH: The patient's mood and manner are appropriate.  ____________________________________________   LABS (all labs ordered are listed, but only abnormal results are displayed)  Labs Reviewed  BASIC METABOLIC PANEL - Abnormal; Notable for the following components:      Result Value   Calcium 8.8 (*)    All other components within normal limits  CBC WITH DIFFERENTIAL/PLATELET  POC URINE PREG, ED   ____________________________________________  EKG   ____________________________________________  RADIOLOGY I, Kayleann Mccaffery, personally viewed and evaluated these images (plain radiographs) as part of my medical decision making, as well as reviewing the written report by the radiologist.  ED MD interpretation: No intracranial hemorrhage, CVT, mass appreciated.  Official radiology report(s): CT VENOGRAM HEAD  Result Date: 12/06/2020 CLINICAL DATA:  35 year old female with headache for 2 weeks. Pain behind her eyes. Query dural venous sinus thrombosis. EXAM: CT VENOGRAM HEAD TECHNIQUE: Multi detector head CT without contrast followed by Venographic phase images of the brain were obtained following the administration of intravenous contrast. Multiplanar reformats and maximum intensity  projections were generated. CONTRAST:  74mL OMNIPAQUE IOHEXOL 350 MG/ML SOLN COMPARISON:  None. FINDINGS: CT HEAD Brain: Partially empty sella. Normal cerebral volume. No midline shift, ventriculomegaly, mass effect, evidence of mass lesion, intracranial hemorrhage or evidence of cortically based acute infarction. Gray-white matter differentiation is within normal limits throughout the brain. Vascular: No suspicious intracranial vascular hyperdensity. Calvarium and skull base: Intact, negative. Paranasal sinuses: Visualized paranasal sinuses and mastoids are clear. Orbits: Visualized orbits and scalp soft tissues are within normal limits. CTA Venous sinuses: Suboptimal intracranial contrast enhancement. However, there is confirmed enhancement and patency of the superior sagittal sinus, torcula, straight sinus, vein of Galen, internal cerebral veins, basal veins of Rosenthal, bilateral transverse sinuses, bilateral sigmoid sinuses, and both IJ bulbs. The major draining cortical veins at the vertex also appear to be enhancing, patent. Anatomic variants: None. Other findings: No abnormal intracranial enhancement is identified. Review of the MIP images confirms the above findings IMPRESSION: 1. Negative for dural venous sinus thrombosis. 2. Positive for partially empty sella, often a normal anatomic variant but can be associated with idiopathic intracranial hypertension (pseudotumor cerebri). 3. Otherwise normal Head CT. Electronically Signed   By: 70m M.D.   On: 12/06/2020 05:41    ____________________________________________   PROCEDURES  Procedure(s) performed (including Critical Care):  Procedures  ________________   INITIAL IMPRESSION / ASSESSMENT AND PLAN / ED COURSE  As part of my medical decision making, I reviewed the following data within the electronic MEDICAL RECORD NUMBER Nursing notes reviewed and incorporated, Labs reviewed , Old chart reviewed, CT reviewed, Notes from prior ED visits, and  [place] Controlled Substance Database         Patient here with complaints of continuous headache after COVID-19 diagnosis.  Discussed with patient that this is likely a migraine headache but given it started after COVID-19, recommended CT of her head to evaluate for venous sinus thrombosis.  She has no focal neurologic deficits.  I have low suspicion for meningitis, intracranial hemorrhage, stroke, mass.  Will give IV fluids, Compazine.  Labs pending.  Pregnancy test negative.  ED PROGRESS  Patient began feeling very restless, agitated after Compazine.  Discussed with patient that I suspect this is akathisia from this medication.  I have listed as one of her allergies now.  Will give Benadryl.  She reports her headache has almost completely resolved.    5:50 AM  On reevaluation, patient's akathisia has resolved but states now she is having return of her headache.  CT scan shows no CVT, mass or intracranial hemorrhage but does show a partially empty sella which could be representative of pseudotumor cerebri.  She has never been given this diagnosis before.  She denies any vision changes.  Have again offered her something else for her headache here but she states that she needs to leave to pick her children out.  Will provide with brief course of tramadol so that she has something at home given she reports over-the-counter medications are not helping.  We did have a very lengthy discussion that she needs close outpatient neurology follow-up.  Discussed that she will need a lumbar puncture.  This has been offered here in the ED for symptomatic relief but she states again that she needs to leave.  We discussed that LP can be performed as an outpatient as well.  She has a PCP for follow-up as well.  We discussed that if she were develop worsening headaches, vomiting, neurologic deficits especially changes in her vision that she should return to the emergency department immediately.  She verbalized  understanding.   At this time, I do not feel there is any life-threatening condition present. I have reviewed, interpreted and discussed all results (EKG, imaging, lab, urine as appropriate) and exam findings with patient/family. I have reviewed nursing notes and appropriate previous records.  I feel the patient is safe to be discharged home without further emergent workup and can continue workup as an outpatient as needed. Discussed usual and customary return precautions. Patient/family verbalize understanding and are comfortable with this plan.  Outpatient follow-up has been provided as needed. All questions have been answered.  ____________________________________________   FINAL CLINICAL IMPRESSION(S) / ED DIAGNOSES  Final diagnoses:  Bad headache     ED Discharge Orders          Ordered    traMADol (ULTRAM) 50 MG tablet  Every 8 hours PRN        12/06/20 0557    ondansetron (ZOFRAN ODT) 4 MG disintegrating tablet  Every 6 hours PRN        12/06/20 0557            *Please note:  Morgan Baxter was evaluated in Emergency Department on 12/06/2020 for the symptoms described in the history of  present illness. She was evaluated in the context of the global COVID-19 pandemic, which necessitated consideration that the patient might be at risk for infection with the SARS-CoV-2 virus that causes COVID-19. Institutional protocols and algorithms that pertain to the evaluation of patients at risk for COVID-19 are in a state of rapid change based on information released by regulatory bodies including the CDC and federal and state organizations. These policies and algorithms were followed during the patient's care in the ED.  Some ED evaluations and interventions may be delayed as a result of limited staffing during and the pandemic.*   Note:  This document was prepared using Dragon voice recognition software and may include unintentional dictation errors.    Tayja Manzer, Layla Maw, DO 12/06/20  [phone]

## 2021-04-10 ENCOUNTER — Ambulatory Visit (LOCAL_COMMUNITY_HEALTH_CENTER): Payer: Medicaid Other | Admitting: Family Medicine

## 2021-04-10 ENCOUNTER — Other Ambulatory Visit: Payer: Self-pay

## 2021-04-10 VITALS — BP 147/91 | Ht 63.0 in | Wt 252.0 lb

## 2021-04-10 DIAGNOSIS — Z5321 Procedure and treatment not carried out due to patient leaving prior to being seen by health care provider: Secondary | ICD-10-CM

## 2021-04-10 NOTE — Progress Notes (Signed)
Pt here for PE and Depo.  Pt states that she had a BTL.  Pt unable to be seen in our clinic, due to sterilization.  Pt declines STD testing.  Berdie Ogren, RN

## 2021-04-10 NOTE — Progress Notes (Signed)
Pt not seen by provider for PE,  d/t  a hx of sterilization.  Pt offered STI testing.  Pt declined  Wendi Snipes, FNP

## 2021-04-14 ENCOUNTER — Other Ambulatory Visit: Payer: Self-pay

## 2021-04-14 ENCOUNTER — Ambulatory Visit
Admission: EM | Admit: 2021-04-14 | Discharge: 2021-04-14 | Disposition: A | Discharge status: Home / Self Care | Payer: Medicaid Other | Attending: Emergency Medicine | Admitting: Emergency Medicine

## 2021-04-14 ENCOUNTER — Encounter: Payer: Self-pay | Admitting: Emergency Medicine

## 2021-04-14 DIAGNOSIS — B349 Viral infection, unspecified: Secondary | ICD-10-CM | POA: Diagnosis not present

## 2021-04-14 MED ORDER — ACETAMINOPHEN 325 MG PO TABS
650.0000 mg | ORAL_TABLET | Freq: Once | ORAL | Status: AC
  Administered 2021-04-14: 650 mg via ORAL

## 2021-04-14 NOTE — ED Triage Notes (Signed)
Pt here with congestion, sore throat, ear pain, fever, headache, and bodyaches  since yesterday.

## 2021-04-14 NOTE — ED Provider Notes (Signed)
[facility]    CSN: [id number] Arrival date & time: 04/14/21  1022      History   Chief Complaint Chief Complaint  Patient presents with   Nasal Congestion   Sore Throat   Fever   Generalized Body Aches   Cough   Headache   Otalgia    HPI Morgan Baxter is a 36 y.o. female.  Patient presents with 1 day history of fever, chills, fatigue, body aches, headache, earache, congestion, sore throat.  Treatment at home with OTC cold and flu medication.  No rash, cough, shortness of breath, vomiting, diarrhea, or other symptoms.    The history is provided by the patient and medical records.   History reviewed. No pertinent past medical history.  Patient Active Problem List   Diagnosis Date Noted   Eye irritation 10/30/2020   Conjunctivitis of left eye 10/30/2020    Past Surgical History:  Procedure Laterality Date   PILONIDAL CYST DRAINAGE      OB History   No obstetric history on file.      Home Medications    Prior to Admission medications   Medication Sig Start Date End Date Taking? Authorizing Provider  erythromycin ophthalmic ointment Place a 1/2 inch ribbon of ointment into the lower eyelids every 6 hours x 5 days 10/30/20   Defelice, Para March, NP  medroxyPROGESTERone (DEPO-PROVERA) 150 MG/ML injection Inject into the muscle. 08/12/19 10/29/21  [provider]  ondansetron (ZOFRAN ODT) 4 MG disintegrating tablet Take 1 tablet (4 mg total) by mouth every 6 (six) hours as needed for nausea or vomiting. 12/06/20   Ward, Layla Maw, DO  traMADol (ULTRAM) 50 MG tablet Take 1 tablet (50 mg total) by mouth every 8 (eight) hours as needed. 12/06/20 12/06/21  Ward, Layla Maw, DO    Family History Family History  Family history unknown: Yes    Social History Social History   Tobacco Use   Smoking status: Every Day    Types: Cigarettes   Smokeless tobacco: Never  Vaping Use   Vaping Use: Never used  Substance Use Topics   Alcohol use: Not Currently    Drug use: Not Currently     Allergies   Compazine [prochlorperazine] and Vicodin [hydrocodone-acetaminophen]   Review of Systems Review of Systems  Constitutional:  Positive for chills, fatigue and fever.  HENT:  Positive for congestion, ear pain, rhinorrhea and sore throat.   Respiratory:  Negative for cough and shortness of breath.   Cardiovascular:  Negative for chest pain and palpitations.  Gastrointestinal:  Negative for diarrhea and vomiting.  Skin:  Negative for color change and rash.  Neurological:  Positive for headaches.  All other systems reviewed and are negative.   Physical Exam Triage Vital Signs ED Triage Vitals [04/14/21 1204]  Enc Vitals Group     BP 125/83     Pulse Rate 74     Resp 18     Temp 98.1 F (36.7 C)     Temp Source Oral     SpO2 99 %     Weight      Height      Head Circumference      Peak Flow      Pain Score      Pain Loc      Pain Edu?      Excl. in GC?    No data found.  Updated Vital Signs BP 125/83 (BP Location: Left Arm)    Pulse 74  Temp 98.1 F (36.7 C) (Oral)    Resp 18    LMP 03/31/2021 (Exact Date)    SpO2 99%   Visual Acuity Right Eye Distance:   Left Eye Distance:   Bilateral Distance:    Right Eye Near:   Left Eye Near:    Bilateral Near:     Physical Exam Vitals and nursing note reviewed.  Constitutional:      General: She is not in acute distress.    Appearance: She is well-developed.  HENT:     Right Ear: Tympanic membrane normal.     Left Ear: Tympanic membrane normal.     Nose: Nose normal.     Mouth/Throat:     Mouth: Mucous membranes are moist.     Pharynx: Oropharynx is clear.  Cardiovascular:     Rate and Rhythm: Normal rate and regular rhythm.     Heart sounds: Normal heart sounds.  Pulmonary:     Effort: Pulmonary effort is normal. No respiratory distress.     Breath sounds: Normal breath sounds.  Abdominal:     Palpations: Abdomen is soft.     Tenderness: There is no abdominal  tenderness.  Musculoskeletal:     Cervical back: Neck supple.  Skin:    General: Skin is warm and dry.  Neurological:     Mental Status: She is alert.  Psychiatric:        Mood and Affect: Mood normal.        Behavior: Behavior normal.     UC Treatments / Results  Labs (all labs ordered are listed, but only abnormal results are displayed) Labs Reviewed  NOVEL CORONAVIRUS, NAA  POCT INFLUENZA A/B    EKG   Radiology No results found.  Procedures Procedures (including critical care time)  Medications Ordered in UC Medications  acetaminophen (TYLENOL) tablet 650 mg (650 mg Oral Given 04/14/21 1225)    Initial Impression / Assessment and Plan / UC Course  I have reviewed the triage vital signs and the nursing notes.  Pertinent labs & imaging results that were available during my care of the patient were reviewed by me and considered in my medical decision making (see chart for details).    Viral illness.  Rapid flu negative.  COVID pending.  Instructed patient to self quarantine per CDC guidelines.  Discussed symptomatic treatment including Tylenol or ibuprofen, rest, hydration.  Instructed patient to follow up with PCP if symptoms are not improving.  Patient agrees to plan of care.   Final Clinical Impressions(s) / UC Diagnoses   Final diagnoses:  Viral illness     Discharge Instructions      Your flu test is negative.  Your COVID test is pending.  You should self quarantine until the test result is back.    Take Tylenol or ibuprofen as needed for fever or discomfort.  Rest and keep yourself hydrated.    Follow-up with your primary care provider if your symptoms are not improving.         ED Prescriptions   None    PDMP not reviewed this encounter.   Mickie Bail, NP 04/14/21 1302

## 2021-04-14 NOTE — Discharge Instructions (Addendum)
Your flu test is negative.  Your COVID test is pending.  You should self quarantine until the test result is back.   ° °Take Tylenol or ibuprofen as needed for fever or discomfort.  Rest and keep yourself hydrated.   ° °Follow-up with your primary care provider if your symptoms are not improving.   ° ° °

## 2021-04-15 LAB — NOVEL CORONAVIRUS, NAA: SARS-CoV-2, NAA: NOT DETECTED

## 2021-04-15 LAB — SARS-COV-2, NAA 2 DAY TAT

## 2021-05-10 ENCOUNTER — Ambulatory Visit (LOCAL_COMMUNITY_HEALTH_CENTER): Payer: Self-pay

## 2021-05-10 ENCOUNTER — Other Ambulatory Visit: Payer: Self-pay

## 2021-05-10 DIAGNOSIS — Z111 Encounter for screening for respiratory tuberculosis: Secondary | ICD-10-CM

## 2021-05-13 ENCOUNTER — Ambulatory Visit (LOCAL_COMMUNITY_HEALTH_CENTER): Payer: Self-pay

## 2021-05-13 ENCOUNTER — Other Ambulatory Visit: Payer: Self-pay

## 2021-05-13 DIAGNOSIS — Z111 Encounter for screening for respiratory tuberculosis: Secondary | ICD-10-CM

## 2021-05-13 LAB — TB SKIN TEST
Induration: 0 mm
TB Skin Test: NEGATIVE

## 2021-05-20 ENCOUNTER — Ambulatory Visit
Admission: RE | Admit: 2021-05-20 | Discharge: 2021-05-20 | Disposition: A | Discharge status: Home / Self Care | Payer: Medicaid Other | Source: Ambulatory Visit | Attending: Emergency Medicine | Admitting: Emergency Medicine

## 2021-05-20 ENCOUNTER — Other Ambulatory Visit: Payer: Self-pay

## 2021-05-20 VITALS — BP 132/82 | HR 76 | Temp 98.2°F | Resp 18

## 2021-05-20 DIAGNOSIS — Z1152 Encounter for screening for COVID-19: Secondary | ICD-10-CM

## 2021-05-20 DIAGNOSIS — B349 Viral infection, unspecified: Secondary | ICD-10-CM | POA: Diagnosis not present

## 2021-05-20 NOTE — Discharge Instructions (Addendum)
Your COVID and Flu tests are pending.  You should self quarantine until the test results are back.    Take Tylenol or ibuprofen as needed for fever or discomfort.  Rest and keep yourself hydrated.    Follow-up with your primary care provider if your symptoms are not improving.     

## 2021-05-20 NOTE — ED Triage Notes (Addendum)
Pt presents with cough, congestion, sneezing since and bodyaches yesterday.

## 2021-05-20 NOTE — ED Provider Notes (Signed)
Morgan Baxter    CSN: [id number] Arrival date & time: 05/20/21  0845      History   Chief Complaint Chief Complaint  Patient presents with   Cough   Chills   sneezing     HPI Morgan Baxter is a 36 y.o. female.  Patient presents with body aches, congestion, cough since yesterday.  Her children have similar symptoms.  She denies fever, rash, sore throat, shortness of breath, vomiting, diarrhea, or symptoms.  No treatments at home.  No pertinent medical history.  The history is provided by the patient.   History reviewed. No pertinent past medical history.  Patient Active Problem List   Diagnosis Date Noted   Eye irritation 10/30/2020   Conjunctivitis of left eye 10/30/2020    Past Surgical History:  Procedure Laterality Date   PILONIDAL CYST DRAINAGE      OB History   No obstetric history on file.      Home Medications    Prior to Admission medications   Medication Sig Start Date End Date Taking? Authorizing Provider  erythromycin ophthalmic ointment Place a 1/2 inch ribbon of ointment into the lower eyelids every 6 hours x 5 days Patient not taking: Reported on 05/10/2021 10/30/20   Defelice, Para March, NP  medroxyPROGESTERone (DEPO-PROVERA) 150 MG/ML injection Inject into the muscle. 08/12/19 10/29/21  [provider]  ondansetron (ZOFRAN ODT) 4 MG disintegrating tablet Take 1 tablet (4 mg total) by mouth every 6 (six) hours as needed for nausea or vomiting. Patient not taking: Reported on 05/10/2021 12/06/20   Ward, Layla Maw, DO  traMADol (ULTRAM) 50 MG tablet Take 1 tablet (50 mg total) by mouth every 8 (eight) hours as needed. Patient not taking: Reported on 05/10/2021 12/06/20 12/06/21  Ward, Layla Maw, DO    Family History Family History  Family history unknown: Yes    Social History Social History   Tobacco Use   Smoking status: Every Day    Types: Cigarettes   Smokeless tobacco: Never  Vaping Use   Vaping Use: Never used  Substance Use  Topics   Alcohol use: Not Currently   Drug use: Not Currently     Allergies   Compazine [prochlorperazine], Penicillin g, and Vicodin [hydrocodone-acetaminophen]   Review of Systems Review of Systems  Constitutional:  Negative for chills and fever.  HENT:  Positive for congestion. Negative for ear pain and sore throat.   Respiratory:  Positive for cough. Negative for shortness of breath.   Cardiovascular:  Negative for chest pain and palpitations.  Gastrointestinal:  Negative for diarrhea and vomiting.  Skin:  Negative for color change and rash.  All other systems reviewed and are negative.   Physical Exam Triage Vital Signs ED Triage Vitals  Enc Vitals Group     BP      Pulse      Resp      Temp      Temp src      SpO2      Weight      Height      Head Circumference      Peak Flow      Pain Score      Pain Loc      Pain Edu?      Excl. in GC?    No data found.  Updated Vital Signs BP 132/82    Pulse 76    Temp 98.2 F (36.8 C)    Resp 18    SpO2  97%   Visual Acuity Right Eye Distance:   Left Eye Distance:   Bilateral Distance:    Right Eye Near:   Left Eye Near:    Bilateral Near:     Physical Exam Vitals and nursing note reviewed.  Constitutional:      General: She is not in acute distress.    Appearance: Normal appearance. She is well-developed. She is not ill-appearing.  HENT:     Right Ear: Tympanic membrane normal.     Left Ear: Tympanic membrane normal.     Nose: Nose normal.     Mouth/Throat:     Mouth: Mucous membranes are moist.     Pharynx: Oropharynx is clear.  Cardiovascular:     Rate and Rhythm: Normal rate and regular rhythm.     Heart sounds: Normal heart sounds.  Pulmonary:     Effort: Pulmonary effort is normal. No respiratory distress.     Breath sounds: Normal breath sounds.  Musculoskeletal:     Cervical back: Neck supple.  Skin:    General: Skin is warm and dry.  Neurological:     Mental Status: She is alert.   Psychiatric:        Mood and Affect: Mood normal.        Behavior: Behavior normal.     UC Treatments / Results  Labs (all labs ordered are listed, but only abnormal results are displayed) Labs Reviewed  COVID-19, FLU A+B NAA    EKG   Radiology No results found.  Procedures Procedures (including critical care time)  Medications Ordered in UC Medications - No data to display  Initial Impression / Assessment and Plan / UC Course  I have reviewed the triage vital signs and the nursing notes.  Pertinent labs & imaging results that were available during my care of the patient were reviewed by me and considered in my medical decision making (see chart for details).   Viral illness.  COVID and Flu pending.  Instructed patient to self quarantine per CDC guidelines.  Discussed symptomatic treatment including Tylenol or ibuprofen, rest, hydration.  Instructed patient to follow up with PCP if symptoms are not improving.  Patient agrees to plan of care.    Final Clinical Impressions(s) / UC Diagnoses   Final diagnoses:  Encounter for screening for COVID-19  Viral illness     Discharge Instructions      Your COVID and Flu tests are pending.  You should self quarantine until the test results are back.    Take Tylenol or ibuprofen as needed for fever or discomfort.  Rest and keep yourself hydrated.    Follow-up with your primary care provider if your symptoms are not improving.         ED Prescriptions   None    PDMP not reviewed this encounter.   Mickie Bail, NP 05/20/21 [phone]

## 2021-05-22 LAB — COVID-19, FLU A+B NAA
Influenza A, NAA: NOT DETECTED
Influenza B, NAA: NOT DETECTED
SARS-CoV-2, NAA: NOT DETECTED

## 2022-04-15 IMAGING — CT CT VENOGRAM HEAD
3 of 10 series · 16 of 47 positions shown · IV contrast (omnipaque)
Comparison: None.

CLINICAL DATA: 35-year-old female with headache for 2 weeks. Pain
behind her eyes. Query dural venous sinus thrombosis.

EXAM:
CT VENOGRAM HEAD
TECHNIQUE: Multi detector head CT without contrast followed by Venographic
phase images of the brain were obtained following the administration
of intravenous contrast. Multiplanar reformats and maximum intensity
projections were generated.
CONTRAST:  75mL OMNIPAQUE IOHEXOL 350 MG/ML SOLN

[Series 7: ax thin · axial · 0.33mm/px · z∈[-157,-5]mm · 10 of 178 slices shown]
[im 12/178  brain]
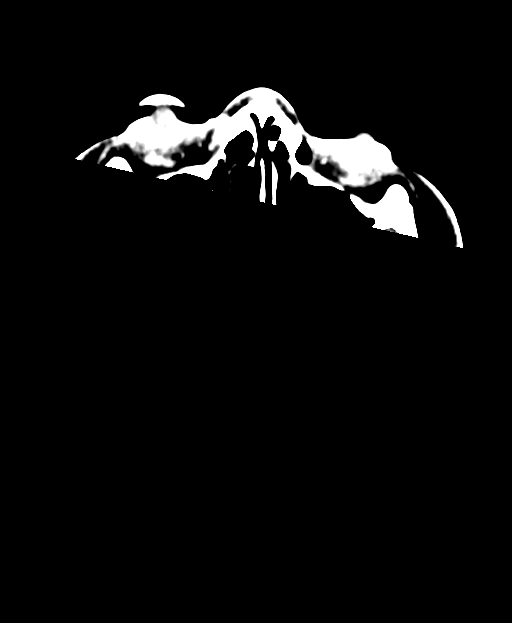
[im 36/178  bone]
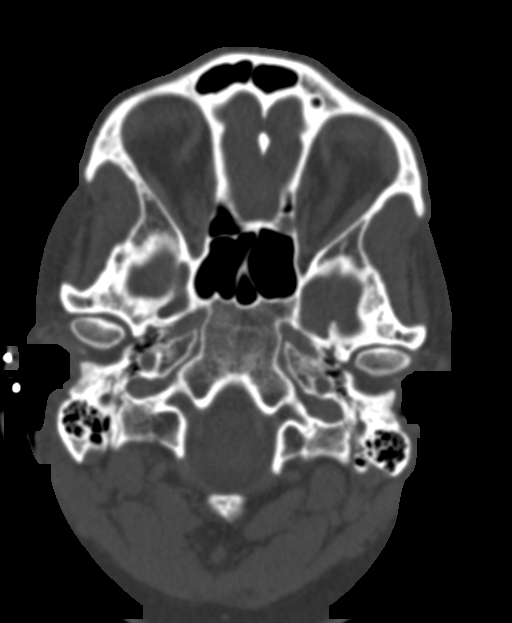
[im 48/178  brain]
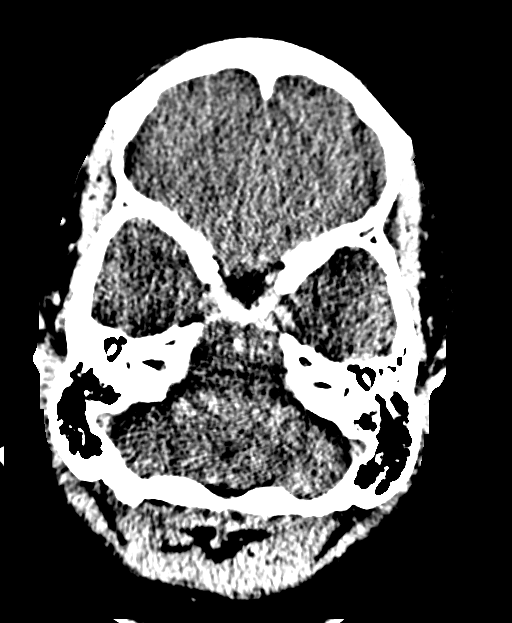
[im 60/178  bone]
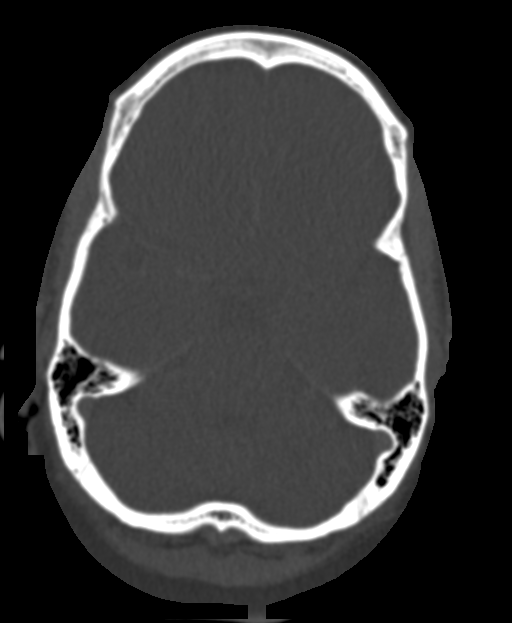
[im 83/178  brain]
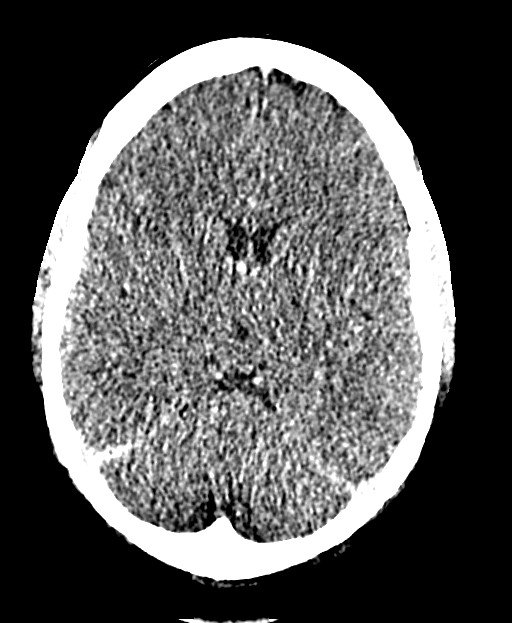
[im 95/178  bone]
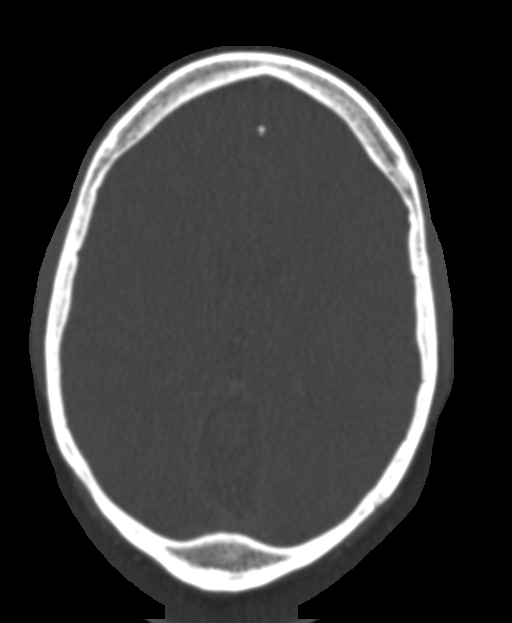
[im 119/178  brain]
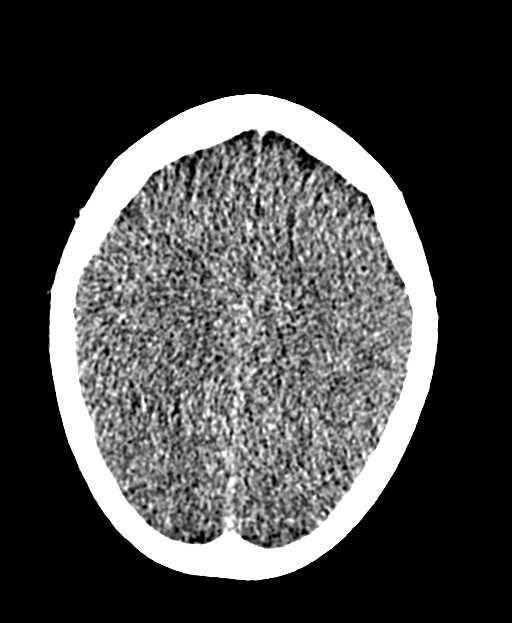
[im 130/178  bone]
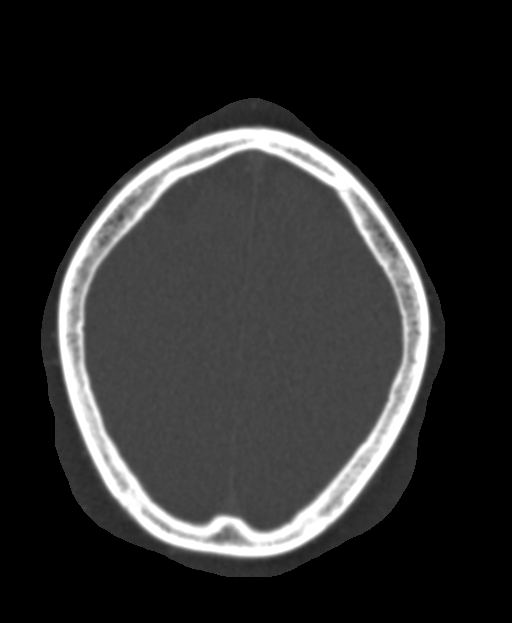
[im 142/178  brain]
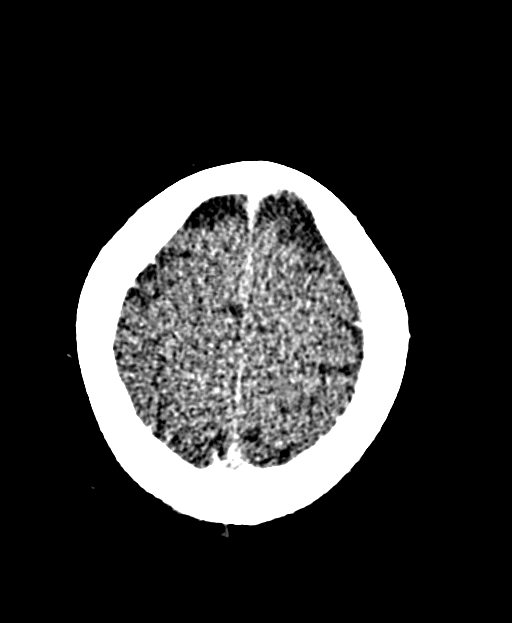
[im 166/178  bone]
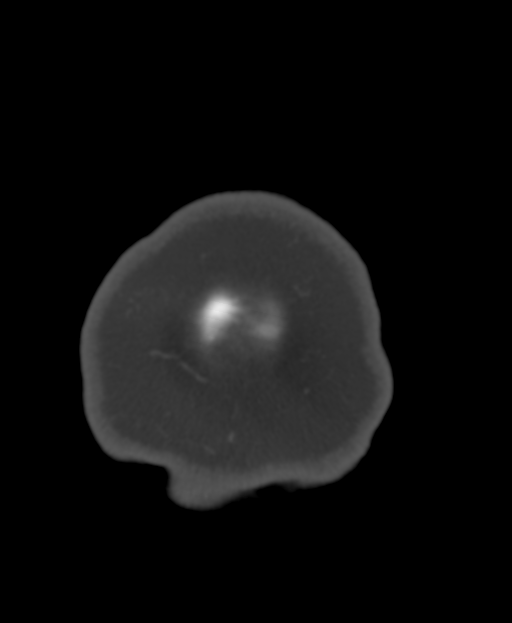

[Series 9: cor thin · coronal · 0.33mm/px · 3 of 207 slices shown]
[im 42/207  brain]
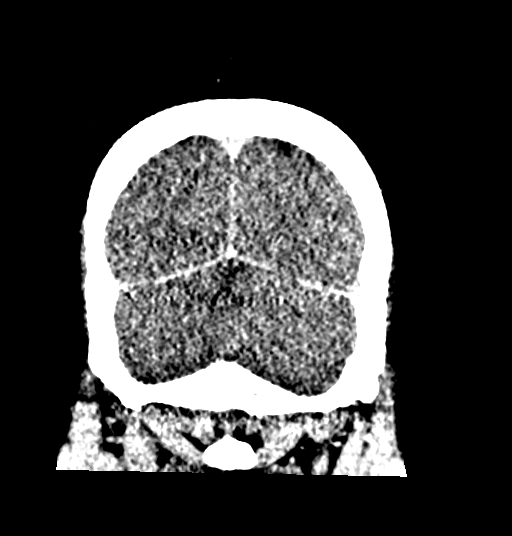
[im 83/207  brain]
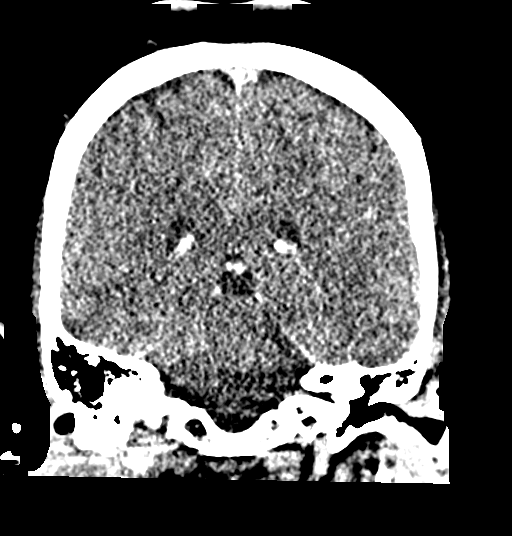
[im 124/207  brain]
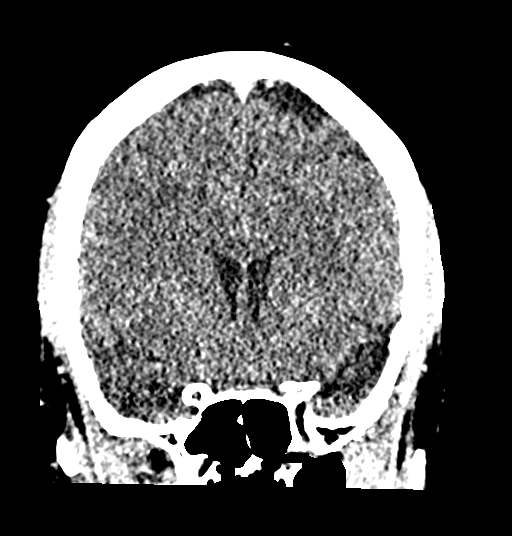

[Series 11: sag thin · sagittal · 0.35mm/px · 3 of 171 slices shown]
[im 43/171  brain]
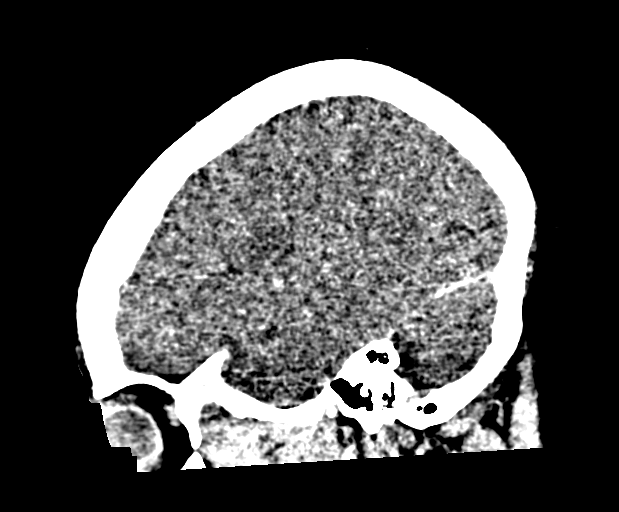
[im 86/171  brain]
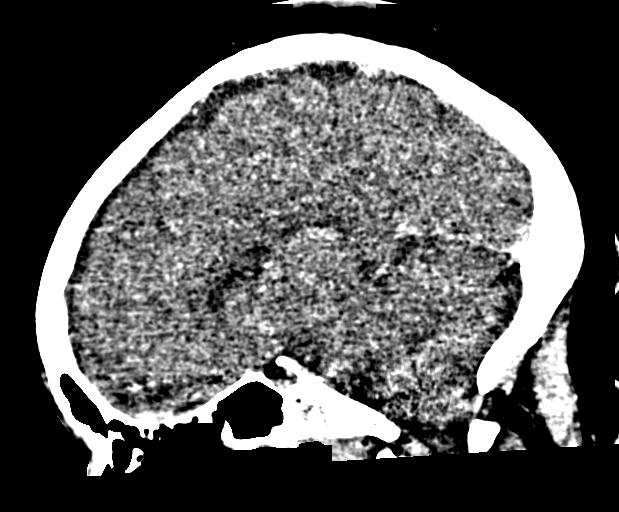
[im 128/171  brain]
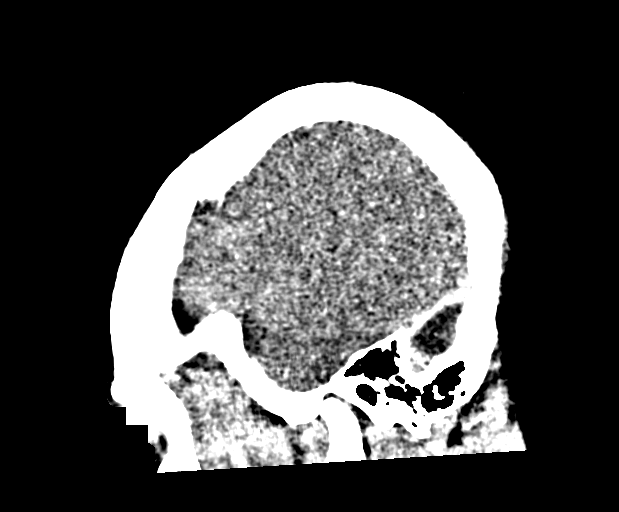

[16 of 47 positions shown; findings below may reference images not displayed]

FINDINGS: CT HEAD

Brain: Partially empty sella. Normal cerebral volume. No midline
shift, ventriculomegaly, mass effect, evidence of mass lesion,
intracranial hemorrhage or evidence of cortically based acute
infarction. Gray-white matter differentiation is within normal
limits throughout the brain.

Vascular: No suspicious intracranial vascular hyperdensity.

Calvarium and skull base: Intact, negative.

Paranasal sinuses: Visualized paranasal sinuses and mastoids are
clear.

Orbits: Visualized orbits and scalp soft tissues are within normal
limits.

CTA Venous sinuses: Suboptimal intracranial contrast enhancement.
However, there is confirmed enhancement and patency of the superior
sagittal sinus, torcula, straight sinus, vein of Van Binh, internal
cerebral veins, basal veins of Mito, bilateral transverse
sinuses, bilateral sigmoid sinuses, and both IJ bulbs. The major
draining cortical veins at the vertex also appear to be enhancing,
patent.

Anatomic variants: None.

Other findings: No abnormal intracranial enhancement is identified.

Review of the MIP images confirms the above findings
IMPRESSION: 1. Negative for dural venous sinus thrombosis.
2. Positive for partially empty sella, often a normal anatomic
variant but can be associated with idiopathic intracranial
hypertension (pseudotumor cerebri).
3. Otherwise normal Head CT.

## 2022-08-02 ENCOUNTER — Ambulatory Visit
Admission: EM | Admit: 2022-08-02 | Discharge: 2022-08-02 | Disposition: A | Discharge status: Home / Self Care | Payer: Medicaid Other | Attending: Urgent Care | Admitting: Urgent Care

## 2022-08-02 DIAGNOSIS — J3489 Other specified disorders of nose and nasal sinuses: Secondary | ICD-10-CM

## 2022-08-02 DIAGNOSIS — R0981 Nasal congestion: Secondary | ICD-10-CM

## 2022-08-02 DIAGNOSIS — J069 Acute upper respiratory infection, unspecified: Secondary | ICD-10-CM | POA: Diagnosis not present

## 2022-08-02 MED ORDER — FLUTICASONE PROPIONATE 50 MCG/ACT NA SUSP: 1.0000 | Freq: Two times a day (BID) | NASAL | 0 refills | Status: AC

## 2022-08-02 MED ORDER — MONTELUKAST SODIUM 10 MG PO TABS: 10.0000 mg | ORAL_TABLET | Freq: Every day | ORAL | 0 refills | Status: AC

## 2022-08-02 MED ORDER — CETIRIZINE HCL 10 MG PO TABS: 10.0000 mg | ORAL_TABLET | Freq: Every day | ORAL | 0 refills | Status: AC

## 2022-08-02 NOTE — ED Provider Notes (Signed)
[name]    CSN: [id number] Arrival date & time: 08/02/22  4098      History   Chief Complaint Chief Complaint  Patient presents with   Nasal Congestion   Cough   Sore Throat    HPI Morgan Baxter is a 37 y.o. female.   Pleasant 37yo female presents today with concerns of nasal congestion, cough and sinus pressure over the past 3 days. Started with cough just this morning, dry. Has been taking OTC dayquil and alka-seltzer plus. States the alka-seltzer helped some. Does have hx of asthma in remote past, has not required inhalers recently. Pt does smoke. Denies sick contacts or concern for covid. Does have some chest pressure when coughing but otherwise denies CP, palps, SOB or DOE. No wheezing. No fever. No GI sx.    Cough Sore Throat    History reviewed. No pertinent past medical history.  Patient Active Problem List   Diagnosis Date Noted   Eye irritation 10/30/2020   Conjunctivitis of left eye 10/30/2020    Past Surgical History:  Procedure Laterality Date   PILONIDAL CYST DRAINAGE     TUBAL LIGATION      OB History   No obstetric history on file.      Home Medications    Prior to Admission medications   Medication Sig Start Date End Date Taking? Authorizing Provider  cetirizine (ZYRTEC) 10 MG tablet Take 1 tablet (10 mg total) by mouth daily. 08/02/22  Yes Bette Brienza L, PA  fluticasone (FLONASE) 50 MCG/ACT nasal spray Place 1 spray into both nostrils in the morning and at bedtime. 08/02/22  Yes Verdelle Valtierra L, PA  montelukast (SINGULAIR) 10 MG tablet Take 1 tablet (10 mg total) by mouth at bedtime. 08/02/22  Yes Gerrell Tabet, Jodelle Gross, PA    Family History Family History  Family history unknown: Yes    Social History Social History   Tobacco Use   Smoking status: Every Day    Types: Cigarettes   Smokeless tobacco: Never  Vaping Use   Vaping Use: Never used  Substance Use Topics   Alcohol use: Not Currently   Drug use: Not Currently      Allergies   Compazine [prochlorperazine], Penicillin g, and Vicodin [hydrocodone-acetaminophen]   Review of Systems Review of Systems  Respiratory:  Positive for cough.   As per HPI   Physical Exam Triage Vital Signs ED Triage Vitals  Enc Vitals Group     BP 08/02/22 0933 122/79     Pulse Rate 08/02/22 0933 67     Resp 08/02/22 0933 18     Temp 08/02/22 0933 98.2 F (36.8 C)     Temp src --      SpO2 08/02/22 0933 97 %     Weight --      Height --      Head Circumference --      Peak Flow --      Pain Score 08/02/22 0937 7     Pain Loc --      Pain Edu? --      Excl. in GC? --    No data found.  Updated Vital Signs BP 122/79   Pulse 67   Temp 98.2 F (36.8 C)   Resp 18   LMP 07/15/2022   SpO2 97%   Visual Acuity Right Eye Distance:   Left Eye Distance:   Bilateral Distance:    Right Eye Near:   Left Eye Near:  Bilateral Near:     Physical Exam Vitals and nursing note reviewed.  Constitutional:      Appearance: She is well-developed and normal weight. She is not ill-appearing, toxic-appearing or diaphoretic.  HENT:     Head: Normocephalic and atraumatic.     Right Ear: Tympanic membrane and ear canal normal. No drainage, swelling or tenderness. No middle ear effusion. Tympanic membrane is not erythematous.     Left Ear: Tympanic membrane and ear canal normal. No drainage, swelling or tenderness.  No middle ear effusion. Tympanic membrane is not erythematous.     Nose: Congestion and rhinorrhea present.     Mouth/Throat:     Mouth: Mucous membranes are moist. No oral lesions.     Pharynx: Oropharynx is clear. No pharyngeal swelling, oropharyngeal exudate, posterior oropharyngeal erythema or uvula swelling.     Tonsils: No tonsillar exudate or tonsillar abscesses.  Eyes:     Extraocular Movements:     Right eye: Normal extraocular motion.     Left eye: Normal extraocular motion.     Conjunctiva/sclera: Conjunctivae normal.     Pupils: Pupils  are equal, round, and reactive to light.  Neck:     Thyroid: No thyromegaly.  Cardiovascular:     Rate and Rhythm: Normal rate.     Heart sounds: Normal heart sounds. No murmur heard. Pulmonary:     Effort: Pulmonary effort is normal. No respiratory distress.     Breath sounds: Normal breath sounds. No stridor. No wheezing, rhonchi or rales.  Chest:     Chest wall: No tenderness.  Abdominal:     General: Bowel sounds are normal. There is no distension.     Palpations: Abdomen is soft. There is no mass.     Tenderness: There is no abdominal tenderness. There is no guarding or rebound.     Hernia: No hernia is present.  Musculoskeletal:     Cervical back: Normal range of motion and neck supple.  Lymphadenopathy:     Cervical: No cervical adenopathy.  Skin:    General: Skin is warm.     Capillary Refill: Capillary refill takes less than 2 seconds.     Coloration: Skin is not pale.     Findings: No erythema or rash.  Neurological:     General: No focal deficit present.     Mental Status: She is alert and oriented to person, place, and time.  Psychiatric:        Mood and Affect: Mood normal.        Behavior: Behavior normal.      UC Treatments / Results  Labs (all labs ordered are listed, but only abnormal results are displayed) Labs Reviewed - No data to display  EKG   Radiology No results found.  Procedures Procedures (including critical care time)  Medications Ordered in UC Medications - No data to display  Initial Impression / Assessment and Plan / UC Course  I have reviewed the triage vital signs and the nursing notes.  Pertinent labs & imaging results that were available during my care of the patient were reviewed by me and considered in my medical decision making (see chart for details).     Nasal congestion - likely viral vs allergic. Will tx sx with flonase and zyrtec. May continue alka-seltzer as needed. Viral URI with cough - VSS, lungs CTA. Suspect  viral sx, cough likely secondary to post nasal drainage. Will do short course of Q HS montelukast.   Final Clinical  Impressions(s) / UC Diagnoses   Final diagnoses:  Nasal congestion with rhinorrhea  Viral upper respiratory tract infection with cough     Discharge Instructions      Start taking zyrtec once daily to help clear up the mucous. Use Flonase daily to help with inflammation of the nasal passage. Take one tablet of montelukast every evening. This medication may make you drowsy.  It is also recommended that you use nasal saline/ sinus washes to cleans the sinus passages.  Hot steam from a shower or vaporizer may also be beneficial to help open up the upper airway. Eucalyptus can be helpful.  If any worsening symptoms such as headache, fever, or shortness of breath, please return for recheck.      ED Prescriptions     Medication Sig Dispense Auth. Provider   montelukast (SINGULAIR) 10 MG tablet Take 1 tablet (10 mg total) by mouth at bedtime. 14 tablet Miia Blanks L, PA   fluticasone (FLONASE) 50 MCG/ACT nasal spray Place 1 spray into both nostrils in the morning and at bedtime. 16 mL Srihan Brutus L, PA   cetirizine (ZYRTEC) 10 MG tablet Take 1 tablet (10 mg total) by mouth daily. 14 tablet Morgan Baxter L, [place]      PDMP not reviewed this encounter.   Maretta Bees, [place] 08/02/22 1036

## 2022-08-02 NOTE — Discharge Instructions (Signed)
Start taking zyrtec once daily to help clear up the mucous. Use Flonase daily to help with inflammation of the nasal passage. Take one tablet of montelukast every evening. This medication may make you drowsy.  It is also recommended that you use nasal saline/ sinus washes to cleans the sinus passages.  Hot steam from a shower or vaporizer may also be beneficial to help open up the upper airway. Eucalyptus can be helpful.  If any worsening symptoms such as headache, fever, or shortness of breath, please return for recheck.

## 2022-08-02 NOTE — ED Triage Notes (Addendum)
Patient to Urgent Care with complaints of nasal congestion/ dry cough/ sinus pain and pressure. Denies any known fevers.  Reports symptoms started Thursday evening.   Taking Nyquil/ Alker-seltzer plus/ using a humidifier.

## 2022-12-19 ENCOUNTER — Other Ambulatory Visit: Payer: 59

## 2023-02-06 ENCOUNTER — Other Ambulatory Visit: Payer: Self-pay | Admitting: Family Medicine

## 2023-02-06 DIAGNOSIS — Z1231 Encounter for screening mammogram for malignant neoplasm of breast: Secondary | ICD-10-CM

## 2023-07-22 ENCOUNTER — Emergency Department
Admission: EM | Admit: 2023-07-22 | Discharge: 2023-07-23 | Disposition: A | Discharge status: Home / Self Care | Attending: Emergency Medicine | Admitting: Emergency Medicine

## 2023-07-22 ENCOUNTER — Encounter: Payer: Self-pay | Admitting: *Deleted

## 2023-07-22 ENCOUNTER — Other Ambulatory Visit: Payer: Self-pay

## 2023-07-22 DIAGNOSIS — H9202 Otalgia, left ear: Secondary | ICD-10-CM | POA: Insufficient documentation

## 2023-07-22 DIAGNOSIS — R1310 Dysphagia, unspecified: Secondary | ICD-10-CM | POA: Diagnosis not present

## 2023-07-22 LAB — RESP PANEL BY RT-PCR (RSV, FLU A&B, COVID)  RVPGX2
Influenza A by PCR: NEGATIVE
Influenza B by PCR: NEGATIVE
Resp Syncytial Virus by PCR: NEGATIVE
SARS Coronavirus 2 by RT PCR: NEGATIVE

## 2023-07-22 LAB — GROUP A STREP BY PCR: Group A Strep by PCR: NOT DETECTED

## 2023-07-22 MED ORDER — TRAMADOL HCL 50 MG PO TABS
50.0000 mg | ORAL_TABLET | Freq: Four times a day (QID) | ORAL | 0 refills | Status: AC | PRN
Start: 2023-07-22 — End: 2023-07-27

## 2023-07-22 MED ORDER — KETOROLAC TROMETHAMINE 30 MG/ML IJ SOLN
30.0000 mg | Freq: Once | INTRAMUSCULAR | Status: AC
  Administered 2023-07-22: 30 mg via INTRAMUSCULAR
  Filled 2023-07-22: qty 1

## 2023-07-22 MED ORDER — PREDNISONE 20 MG PO TABS
60.0000 mg | ORAL_TABLET | Freq: Once | ORAL | Status: AC
  Administered 2023-07-22: 60 mg via ORAL
  Filled 2023-07-22: qty 3

## 2023-07-22 MED ORDER — TRAMADOL HCL 50 MG PO TABS
50.0000 mg | ORAL_TABLET | Freq: Once | ORAL | Status: AC
  Administered 2023-07-22: 50 mg via ORAL
  Filled 2023-07-22: qty 1

## 2023-07-22 MED ORDER — PREDNISONE 50 MG PO TABS: ORAL_TABLET | ORAL | 0 refills | Status: AC

## 2023-07-22 NOTE — ED Triage Notes (Signed)
 Pt ambulatory to triage.  Pt has left earache and sore throat.  Pt tearful  pt was seen at urgent care and started on abx   pt states she is not any better.  Pt alert  speech clear.

## 2023-07-22 NOTE — ED Provider Notes (Signed)
 [facility] Provider Note    Event Date/Time   First MD Initiated Contact with Patient 07/22/23 2201     (approximate)   History   Otalgia and Sore Throat    HPI  Morgan Baxter is a 38 y.o. female   with a past medical history of GAD, who presents to the ED complaining of left otalgia, odynophagia. According to the patient, she was seen yesterday in urgent care they prescribed amoxicillin .  Patient states no resolution of her symptoms.  Patient denies cough, fever, chills.      Physical Exam   Triage Vital Signs: ED Triage Vitals  Encounter Vitals Group     BP 07/22/23 2049 (!) 138/102     Systolic BP Percentile --      Diastolic BP Percentile --      Pulse Rate 07/22/23 2049 90     Resp 07/22/23 2049 20     Temp 07/22/23 2049 99.3 F (37.4 C)     Temp Source 07/22/23 2049 Oral     SpO2 07/22/23 2049 99 %     Weight 07/22/23 2052 250 lb (113.4 kg)     Height 07/22/23 2052 5\' 3"  (1.6 m)     Head Circumference --      Peak Flow --      Pain Score 07/22/23 2052 10     Pain Loc --      Pain Education --      Exclude from Growth Chart --     Most recent vital signs: Vitals:   07/22/23 2049  BP: (!) 138/102  Pulse: 90  Resp: 20  Temp: 99.3 F (37.4 C)  SpO2: 99%     Constitutional: Alert, NAD. Able to speak in complete sentences without cough or dyspnea  Eyes: Conjunctivae are normal.  Head: Atraumatic. Ears: Left ear:. Peritympanic erythema, no tympanic perforation, no exudate.  Right ear: Otoscopy within normal limits Nose: No congestion/rhinnorhea. Mouth/Throat: Mucous membranes are moist.   Neck: Painless ROM. Supple. No JVD, nodes, thyromegaly.  Tender to palpation, nodules are  palpable bilaterally. Cardiovascular:   Good peripheral circulation.RRR no murmurs, gallops, rubs  Respiratory: Normal respiratory effort.  No retractions. Clear to auscultation bilaterally without wheezing or crackles  Gastrointestinal: Soft and  nontender.  Musculoskeletal:  no deformity Neurologic:  MAE spontaneously. No gross focal neurologic deficits are appreciated.  Skin:  Skin is warm, dry and intact. No rash noted. Psychiatric: Mood and affect are normal. Speech and behavior are normal.    ED Results / Procedures / Treatments   Labs (all labs ordered are listed, but only abnormal results are displayed) Labs Reviewed  RESP PANEL BY RT-PCR (RSV, FLU A&B, COVID)  [id number]  GROUP A STREP BY PCR  MONONUCLEOSIS SCREEN     EKG     RADIOLOGY     PROCEDURES:  Critical Care performed:   Procedures   MEDICATIONS ORDERED IN ED: Medications  ketorolac  (TORADOL ) 30 MG/ML injection 30 mg (30 mg Intramuscular Given 07/22/23 2306)  predniSONE  (DELTASONE ) tablet 60 mg (60 mg Oral Given 07/22/23 2309)  traMADol  (ULTRAM ) tablet 50 mg (50 mg Oral Given 07/22/23 2347)   Clinical Course as of 07/22/23 2359  Wed Jul 22, 2023  2252 Resp panel by RT-PCR (RSV, Flu A&B, Covid) Anterior Nasal Swab Negative [AE]  2254 Group A Strep by PCR ([facility] Only) Negative [AE]  2343 Reassessed the patient who continues with ear pain. I prescribed tramadol .  [AE]  2344 Mononucleosis  screen [AE]    Clinical Course User Index [AE] Awilda Lennox, PA-C    IMPRESSION / MDM / ASSESSMENT AND PLAN / ED COURSE  I reviewed the triage vital signs and the nursing notes.  Differential diagnosis includes, but is not limited to, otalgia, mononucleosis, tonsillitis, pharyngitis  Patient's presentation is most consistent with acute complicated illness / injury requiring diagnostic workup.  Patient's diagnosis is consistent with . I left ear pain, odynophagia.  According with physical exam and cervical nodules I ordered mononucleosis test, this is pending.  Patient was negative for strep, influenza, COVID, RSV.  During admission patient received Toradol , tramadol  for pain.  Patient is allergic to oxycodone . Transferred care to Dr.Ward at 11:50 PM.   Updated patient and discussed plan of care with patient, answered all of patient's questions, patient agreeable to plan of care.Patient verbalized understanding.  Mononucleosis test pending to decide disposition.  I advised patient to keep taking amoxicillin  until she completes the treatment.      FINAL CLINICAL IMPRESSION(S) / ED DIAGNOSES   Final diagnoses:  Left ear pain     Rx / DC Orders   ED Discharge Orders          Ordered    predniSONE  (DELTASONE ) 50 MG tablet        07/22/23 2339    traMADol  (ULTRAM ) 50 MG tablet  Every 6 hours PRN        07/22/23 2346             Note:  This document was prepared using Dragon voice recognition software and may include unintentional dictation errors.   Awilda Lennox, PA-C 07/23/23 0000    Lind Repine, MD 07/27/23 [phone]

## 2023-07-22 NOTE — ED Notes (Signed)
 Pt states it feels like her ear is erupting and would like something for pain.

## 2023-07-22 NOTE — Discharge Instructions (Addendum)
 Please continue taking amoxicillin  until you finish the treatment.  Please take prednisone  1 tablet by mouth with breakfast, please take tramadol  1 tablet by mouth every 6 hours as needed for pain.  Please make an appointment with your PCP and have a follow-up.  Please drink plenty of fluids.   Mono test was negative.

## 2023-07-23 LAB — MONONUCLEOSIS SCREEN: Mono Screen: NEGATIVE

## 2023-10-01 ENCOUNTER — Encounter

## 2023-10-09 ENCOUNTER — Other Ambulatory Visit: Payer: Self-pay | Admitting: Family

## 2023-10-09 DIAGNOSIS — N644 Mastodynia: Secondary | ICD-10-CM

## 2023-10-12 ENCOUNTER — Other Ambulatory Visit

## 2023-10-12 ENCOUNTER — Inpatient Hospital Stay: Admission: RE | Admit: 2023-10-12 | Source: Ambulatory Visit

## 2023-10-22 ENCOUNTER — Ambulatory Visit
Admission: RE | Admit: 2023-10-22 | Discharge: 2023-10-22 | Disposition: A | Discharge status: Home / Self Care | Source: Ambulatory Visit | Attending: Family | Admitting: Family

## 2023-10-22 ENCOUNTER — Other Ambulatory Visit: Payer: Self-pay | Admitting: Family

## 2023-10-22 DIAGNOSIS — N6489 Other specified disorders of breast: Secondary | ICD-10-CM | POA: Insufficient documentation

## 2023-10-22 DIAGNOSIS — N644 Mastodynia: Secondary | ICD-10-CM

## 2024-03-04 ENCOUNTER — Other Ambulatory Visit

## 2024-03-07 ENCOUNTER — Other Ambulatory Visit
